# Patient Record
Sex: Male | Born: 1990 | Race: White | Hispanic: No | Marital: Single
Health system: Southern US, Community
[De-identification: ages and names within clinical notes are randomized; demographics above are authoritative.]

## PROBLEM LIST (undated history)

## (undated) HISTORY — PX: KNEE SURGERY: SHX244

---
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
## 2012-04-25 ENCOUNTER — Encounter (HOSPITAL_COMMUNITY): Payer: Self-pay | Admitting: Emergency Medicine

## 2012-04-25 ENCOUNTER — Emergency Department (INDEPENDENT_AMBULATORY_CARE_PROVIDER_SITE_OTHER)
Admission: EM | Admit: 2012-04-25 | Discharge: 2012-04-25 | Disposition: A | Discharge status: Home / Self Care | Payer: Medicaid Other | Source: Home / Self Care | Attending: Emergency Medicine | Admitting: Emergency Medicine

## 2012-04-25 DIAGNOSIS — L259 Unspecified contact dermatitis, unspecified cause: Secondary | ICD-10-CM

## 2012-04-25 MED ORDER — TRIAMCINOLONE ACETONIDE 0.1 % EX OINT: TOPICAL_OINTMENT | Freq: Two times a day (BID) | CUTANEOUS | Status: DC

## 2012-04-25 MED ORDER — PREDNISONE 20 MG PO TABS: 40.0000 mg | ORAL_TABLET | Freq: Every day | ORAL | Status: AC

## 2012-04-25 MED ORDER — CETIRIZINE HCL 10 MG PO CHEW: 10.0000 mg | CHEWABLE_TABLET | Freq: Every day | ORAL | Status: DC

## 2012-04-25 NOTE — ED Notes (Signed)
Onset 6/2.  Reports neck and face were the first areas that rash noticed.  Now has spotty areas to abdomen, arms.  Areas are red, itch, burn.

## 2012-04-25 NOTE — ED Provider Notes (Signed)
History     CSN: [id number]  Arrival date & time 04/25/12  1146   First MD Initiated Contact with Patient 04/25/12 1156      Chief Complaint  Patient presents with  . Rash    (Consider location/radiation/quality/duration/timing/severity/associated sxs/prior treatment) HPI Comments: Patient describes a for about 2 days he started with his redness on his chest and his face and now has some swelling around his eyes. It is very itchy and has some other spotty areas in his abdomen since upper arms. His red count of burns and itches he describes that he possibly was walking through a trail it could have been exposed to some type of plants.  Patient denies any other symptoms such as fevers, body aches constitutional symptoms. No respiratory symptoms  Patient is a 21 y.o. male presenting with rash. The history is provided by the patient.  Rash  This is a new problem. The current episode started 2 days ago. The problem has been gradually worsening. There has been no fever. The rash is present on the torso and face. The pain is at a severity of 3/10. The pain is mild. The pain has been constant since onset. Associated symptoms include blisters and itching. Pertinent negatives include no pain. He has tried nothing for the symptoms.    History reviewed. No pertinent past medical history.  Past Surgical History  Procedure Date  . Knee surgery     No family history on file.  History  Substance Use Topics  . Smoking status: Never Smoker   . Smokeless tobacco: Not on file  . Alcohol Use: No      Review of Systems  Constitutional: Negative for chills, activity change, fatigue and unexpected weight change.  Skin: Positive for itching and rash.    Allergies  Review of patient's allergies indicates no known allergies.  Home Medications   Current Outpatient Rx  Name Route Sig Dispense Refill  . DIPHENHYD-CALAMINE-BENZYL ALC 01-05-09.5 % EX CREA Apply externally Apply topically.    Marland Kitchen  CETIRIZINE HCL 10 MG PO CHEW Oral Chew 1 tablet (10 mg total) by mouth daily. 14 tablet 0  . PREDNISONE 20 MG PO TABS Oral Take 2 tablets (40 mg total) by mouth daily. 2 tablets daily for 5 days 10 tablet 0  . TRIAMCINOLONE ACETONIDE 0.1 % EX OINT Topical Apply topically 2 (two) times daily. X  2 weeks except face 80 g 0    BP 99/54  Pulse 52  Temp(Src) 98 F (36.7 C) (Oral)  Resp 16  SpO2 100%  Physical Exam  Constitutional: He appears well-developed and well-nourished.  HENT:  Head: Normocephalic.  Eyes: Conjunctivae are normal. Right eye exhibits no discharge. Left eye exhibits no discharge.  Neck: Neck supple.  Skin: Rash noted. There is erythema.       ED Course  Procedures (including critical care time)  Labs Reviewed - No data to display No results found.   1. Contact dermatitis   prescribed systemic prednisone along with Zyrtec course for 2 weeks and topical triamcinolone    MDM  Facial upper torso and arms with signs of contact dermatitis most likely poison ivy or poison oak. Patient has no other systemic symptoms.        Jimmie Molly, MD 04/25/12 2050

## 2012-04-25 NOTE — Discharge Instructions (Signed)
Contact Dermatitis  Contact dermatitis is a rash that happens when something touches the skin. You touched something that irritates your skin, or you have allergies to something you touched.  HOME CARE    Avoid the thing that caused your rash.   Keep your rash away from hot water, soap, sunlight, chemicals, and other things that might bother it.   Do not scratch your rash.   You can take cool baths to help stop itching.   Only take medicine as told by your doctor.   Keep all doctor visits as told.  GET HELP RIGHT AWAY IF:    Your rash is not better after 3 days.   Your rash gets worse.   Your rash is puffy (swollen), tender, red, sore, or warm.   You have problems with your medicine.  MAKE SURE YOU:    Understand these instructions.   Will watch your condition.   Will get help right away if you are not doing well or get worse.  Document Released: 09/05/2009 Document Revised: 10/28/2011 Document Reviewed: 04/13/2011  ExitCare Patient Information 2012 [employer].

## 2013-02-23 ENCOUNTER — Emergency Department (HOSPITAL_COMMUNITY)
Admission: EM | Admit: 2013-02-23 | Discharge: 2013-02-24 | Disposition: A | Discharge status: Home / Self Care | Payer: Medicaid Other | Attending: Emergency Medicine | Admitting: Emergency Medicine

## 2013-02-23 ENCOUNTER — Encounter (HOSPITAL_COMMUNITY): Payer: Self-pay | Admitting: Emergency Medicine

## 2013-02-23 DIAGNOSIS — L0291 Cutaneous abscess, unspecified: Secondary | ICD-10-CM

## 2013-02-23 DIAGNOSIS — L02419 Cutaneous abscess of limb, unspecified: Secondary | ICD-10-CM | POA: Insufficient documentation

## 2013-02-23 MED ORDER — IBUPROFEN 600 MG PO TABS: 600.0000 mg | ORAL_TABLET | Freq: Four times a day (QID) | ORAL | Status: DC | PRN

## 2013-02-23 MED ORDER — SULFAMETHOXAZOLE-TRIMETHOPRIM 800-160 MG PO TABS: 1.0000 | ORAL_TABLET | Freq: Two times a day (BID) | ORAL | Status: DC

## 2013-02-23 MED ORDER — SULFAMETHOXAZOLE-TMP DS 800-160 MG PO TABS
1.0000 | ORAL_TABLET | Freq: Once | ORAL | Status: AC
  Administered 2013-02-23: 1 via ORAL
  Filled 2013-02-23: qty 1

## 2013-02-23 NOTE — ED Provider Notes (Signed)
History    This chart was scribed for non-physician practitioner working with Sunnie Nielsen, MD by Leone Payor, ED Scribe. This patient was seen in room WTR5/WTR5 and the patient's care was started at 2205.   CSN: [id number]  Arrival date & time 02/23/13  2205   First MD Initiated Contact with Patient 02/23/13 2314      Chief Complaint  Patient presents with  . Wound Check     The history is provided by the patient. No language interpreter was used.    MONTERRIO GERST is a 22 y.o. male who presents to the Emergency Department complaining of a constant red area to R knee from an unknown bite starting a couple days ago. Pt states he tripped and fell to the ground several days ago. States he noticed a bump to his R knee after that that the pt popped. He states popping the bump drained pus. States it is no longer draining today. He denies fever, chills. Onset of symptoms gradual. Nothing makes symptoms better or worse. Patient is ambulatory  Pt denies smoking and alcohol use.  History reviewed. No pertinent past medical history.  Past Surgical History  Procedure Laterality Date  . Knee surgery      No family history on file.  History  Substance Use Topics  . Smoking status: Never Smoker   . Smokeless tobacco: Not on file  . Alcohol Use: No      Review of Systems  Constitutional: Negative for fever.  Gastrointestinal: Negative for nausea and vomiting.  Skin: Positive for color change and wound.  Hematological: Negative for adenopathy.    Allergies  Review of patient's allergies indicates no known allergies.  Home Medications   Current Outpatient Rx  Name  Route  Sig  Dispense  Refill  . ibuprofen (ADVIL,MOTRIN) 200 MG tablet   Oral   Take 800 mg by mouth every 6 (six) hours as needed for pain.           BP 114/42  Pulse 63  Temp(Src) 98.1 F (36.7 C) (Oral)  Resp 16  Ht 5\' 8"  (1.727 m)  Wt 124 lb (56.246 kg)  BMI 18.86 kg/m2  SpO2 100%  Physical Exam   Nursing note and vitals reviewed. Constitutional: He appears well-developed and well-nourished. No distress.  HENT:  Head: Normocephalic and atraumatic.  Eyes: EOM are normal.  Neck: Neck supple. No tracheal deviation present.  Cardiovascular: Normal rate.   Pulmonary/Chest: Effort normal. No respiratory distress.  Musculoskeletal: Normal range of motion.  Neurological: He is alert.  Skin: Skin is warm and dry.  3-4 cm area of erythema overlying right patella. No drainage. Full ROM in the knee.   Psychiatric: He has a normal mood and affect. His behavior is normal.    ED Course  Procedures (including critical care time)  DIAGNOSTIC STUDIES: Oxygen Saturation is 100% on room air, normal by my interpretation.    COORDINATION OF CARE: 11:30 PM Discussed treatment plan with pt at bedside and pt agreed to plan.    Labs Reviewed - No data to display No results found.   1. Abscess and cellulitis    Patient seen and examined. Work-up initiated. Medications ordered.   Vital signs reviewed and are as follows: Filed Vitals:   02/23/13 2357  BP: 100/54  Pulse: 105  Temp: 97.7 F (36.5 C)  Resp: 14   The patient was urged to return to the Emergency Department urgently with worsening pain, swelling, expanding erythema especially  if it streaks away from the affected area, fever, or if they have any other concerns. Patient verbalized understanding.      MDM  Patient with area of cellulitis overlying left patella. Area is minor without drainage. No difficulty with movement of joints to suspect septic arthritis. No fever or other systemic symptoms. I suspect the patient had a small pustule or abscess at the onset. Given this history will treat with Bactrim to cover for MRSA. Patient appears well and nontoxic.     I personally performed the services described in this documentation, which was scribed in my presence. The recorded information has been reviewed and is  accurate.   Renne Crigler, PA-C 02/24/13 [phone]

## 2013-02-23 NOTE — ED Notes (Signed)
Pt  C/o red area to R knee, unkn bite. Area red with dark red center.

## 2013-02-24 NOTE — ED Provider Notes (Signed)
Medical screening examination/treatment/procedure(s) were performed by non-physician practitioner and as supervising physician I was immediately available for consultation/collaboration.  Sunnie Nielsen, MD 02/24/13 [phone]

## 2013-07-25 ENCOUNTER — Encounter (HOSPITAL_COMMUNITY): Payer: Self-pay

## 2013-07-25 ENCOUNTER — Emergency Department (HOSPITAL_COMMUNITY): Payer: Medicaid Other

## 2013-07-25 ENCOUNTER — Emergency Department (HOSPITAL_COMMUNITY)
Admission: EM | Admit: 2013-07-25 | Discharge: 2013-07-25 | Disposition: A | Discharge status: Home / Self Care | Payer: Medicaid Other | Attending: Emergency Medicine | Admitting: Emergency Medicine

## 2013-07-25 DIAGNOSIS — Y92410 Unspecified street and highway as the place of occurrence of the external cause: Secondary | ICD-10-CM | POA: Insufficient documentation

## 2013-07-25 DIAGNOSIS — S5292XA Unspecified fracture of left forearm, initial encounter for closed fracture: Secondary | ICD-10-CM

## 2013-07-25 DIAGNOSIS — Y9389 Activity, other specified: Secondary | ICD-10-CM | POA: Insufficient documentation

## 2013-07-25 DIAGNOSIS — S52599A Other fractures of lower end of unspecified radius, initial encounter for closed fracture: Secondary | ICD-10-CM | POA: Insufficient documentation

## 2013-07-25 MED ORDER — HYDROCODONE-ACETAMINOPHEN 5-325 MG PO TABS: 1.0000 | ORAL_TABLET | Freq: Four times a day (QID) | ORAL | Status: DC | PRN
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
## 2013-07-25 NOTE — ED Notes (Signed)
Patient transported to X-ray 

## 2013-07-25 NOTE — Progress Notes (Signed)
Orthopedic Tech Progress Note Patient Details:  Joshua Austin 01-24-1991 [id number]  Ortho Devices Type of Ortho Device: Sugartong splint;Arm sling Ortho Device/Splint Interventions: Application   Cammer, Mickie Bail 07/25/2013, 10:10 AM

## 2013-07-25 NOTE — ED Notes (Signed)
Ortho notified of splint that is ordered
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
## 2013-07-29 NOTE — ED Provider Notes (Signed)
Medical screening examination/treatment/procedure(s) were performed by non-physician practitioner and as supervising physician I was immediately available for consultation/collaboration.   Jermie Hippe M Ah Bott, DO 07/29/13 1157 

## 2014-08-17 ENCOUNTER — Encounter (HOSPITAL_COMMUNITY): Payer: Self-pay | Admitting: Emergency Medicine

## 2014-08-17 ENCOUNTER — Emergency Department (HOSPITAL_COMMUNITY)
Admission: EM | Admit: 2014-08-17 | Discharge: 2014-08-18 | Disposition: A | Discharge status: Other Acute Inpatient Hospital | Payer: Medicaid Other | Attending: Emergency Medicine | Admitting: Emergency Medicine

## 2014-08-17 DIAGNOSIS — F3289 Other specified depressive episodes: Secondary | ICD-10-CM | POA: Insufficient documentation

## 2014-08-17 DIAGNOSIS — F32A Depression, unspecified: Secondary | ICD-10-CM

## 2014-08-17 DIAGNOSIS — R45851 Suicidal ideations: Secondary | ICD-10-CM | POA: Insufficient documentation

## 2014-08-17 DIAGNOSIS — X789XXA Intentional self-harm by unspecified sharp object, initial encounter: Secondary | ICD-10-CM | POA: Insufficient documentation

## 2014-08-17 DIAGNOSIS — S6990XA Unspecified injury of unspecified wrist, hand and finger(s), initial encounter: Secondary | ICD-10-CM | POA: Diagnosis not present

## 2014-08-17 DIAGNOSIS — F329 Major depressive disorder, single episode, unspecified: Secondary | ICD-10-CM

## 2014-08-17 DIAGNOSIS — F39 Unspecified mood [affective] disorder: Secondary | ICD-10-CM

## 2014-08-17 DIAGNOSIS — X838XXA Intentional self-harm by other specified means, initial encounter: Secondary | ICD-10-CM

## 2014-08-17 LAB — CBC WITH DIFFERENTIAL/PLATELET
BASOS PCT: 0 % (ref 0–1)
Basophils Absolute: 0 10*3/uL (ref 0.0–0.1)
EOS ABS: 0.1 10*3/uL (ref 0.0–0.7)
EOS PCT: 1 % (ref 0–5)
HCT: 39.9 % (ref 39.0–52.0)
HEMOGLOBIN: 14.2 g/dL (ref 13.0–17.0)
LYMPHS ABS: 2.8 10*3/uL (ref 0.7–4.0)
Lymphocytes Relative: 41 % (ref 12–46)
MCH: 29.8 pg (ref 26.0–34.0)
MCHC: 35.6 g/dL (ref 30.0–36.0)
MCV: 83.6 fL (ref 78.0–100.0)
MONO ABS: 0.5 10*3/uL (ref 0.1–1.0)
MONOS PCT: 8 % (ref 3–12)
Neutro Abs: 3.4 10*3/uL (ref 1.7–7.7)
Neutrophils Relative %: 50 % (ref 43–77)
Platelets: 228 10*3/uL (ref 150–400)
RBC: 4.77 MIL/uL (ref 4.22–5.81)
RDW: 12 % (ref 11.5–15.5)
WBC: 6.8 10*3/uL (ref 4.0–10.5)

## 2014-08-17 LAB — URINALYSIS, ROUTINE W REFLEX MICROSCOPIC
BILIRUBIN URINE: NEGATIVE
Glucose, UA: NEGATIVE mg/dL
Hgb urine dipstick: NEGATIVE
Ketones, ur: NEGATIVE mg/dL
LEUKOCYTES UA: NEGATIVE
NITRITE: NEGATIVE
Protein, ur: NEGATIVE mg/dL
SPECIFIC GRAVITY, URINE: 1.031 — AB (ref 1.005–1.030)
UROBILINOGEN UA: 0.2 mg/dL (ref 0.0–1.0)
pH: 5.5 (ref 5.0–8.0)

## 2014-08-17 LAB — COMPREHENSIVE METABOLIC PANEL
ALK PHOS: 85 U/L (ref 39–117)
ALT: 11 U/L (ref 0–53)
ANION GAP: 13 (ref 5–15)
AST: 17 U/L (ref 0–37)
Albumin: 4.3 g/dL (ref 3.5–5.2)
BUN: 12 mg/dL (ref 6–23)
CALCIUM: 9.6 mg/dL (ref 8.4–10.5)
CO2: 27 mEq/L (ref 19–32)
CREATININE: 1.13 mg/dL (ref 0.50–1.35)
Chloride: 102 mEq/L (ref 96–112)
GFR calc non Af Amer: >90 mL/min (ref >90)
GLUCOSE: 94 mg/dL (ref 70–99)
Potassium: 3.9 mEq/L (ref 3.7–5.3)
Sodium: 142 mEq/L (ref 137–147)
TOTAL PROTEIN: 7.3 g/dL (ref 6.0–8.3)
Total Bilirubin: 0.2 mg/dL — ABNORMAL LOW (ref 0.3–1.2)

## 2014-08-17 LAB — ACETAMINOPHEN LEVEL

## 2014-08-17 LAB — RAPID URINE DRUG SCREEN, HOSP PERFORMED
Amphetamines: NOT DETECTED
BARBITURATES: NOT DETECTED
Benzodiazepines: NOT DETECTED
COCAINE: NOT DETECTED
Opiates: NOT DETECTED
Tetrahydrocannabinol: NOT DETECTED

## 2014-08-17 LAB — ETHANOL

## 2014-08-17 LAB — SALICYLATE LEVEL: Salicylate Lvl: <2 mg/dL — ABNORMAL LOW (ref 2.8–20.0)

## 2014-08-17 MED ORDER — OLANZAPINE 5 MG PO TBDP
5.0000 mg | ORAL_TABLET | Freq: Every day | ORAL | Status: DC
  Administered 2014-08-17: 5 mg via ORAL
  Filled 2014-08-17: qty 1

## 2014-08-17 NOTE — ED Notes (Signed)
One belonging bag placed in locker number 27

## 2014-08-17 NOTE — ED Notes (Signed)
Pt alert x4, calm and cooperative, No thoughts of SI/HI will continue to monitor. Estill Dooms, RN 8:55 PM 08/17/2014

## 2014-08-17 NOTE — BH Assessment (Signed)
Clinician consulted with Dr. Arfeen who recommends patient for inpatient admission. Per Eric, AC no appropriate beds available at [facility]. TTS will seek placement.   Daijanae Rafalski, MSW, LCSW Triage Specialist [phone] 

## 2014-08-17 NOTE — ED Notes (Signed)
MD at bedside. 

## 2014-08-17 NOTE — ED Notes (Signed)
Pt. Refused to answer questions from this Nurse upon assessment , just mentioned of "being upset " with what his Mother told him last night. Denies pain.
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
## 2014-08-17 NOTE — ED Notes (Signed)
Patient aware we are in need of a urine sample. Patient states he is unable to void at this time.

## 2014-08-17 NOTE — Progress Notes (Signed)
The following facilities have been contacted regarding bed availability for inptx on pt's behalf:  REFERRAL FAXED: [facility]- per [facility] beds available [facility]- per Tammy can fax for review [facility]- per Clydie Braun can fax for review [facility]- per Britta Mccreedy, a few male beds available [facility]-  Per Joni Reining beds available,take [facility] up to age 21, referral faxed [facility]- per Misty Stanley can fax, referral faxed  AT CAPACITY: [facility]- per Cherene Julian- per Baird Lyons at capacity [facility]- per [place] [facility]ero bed available [facility]- per [facility]- per [facility]- male and gero beds only [facility]- per [facility]- per [facility] Disposition [facility]

## 2014-08-17 NOTE — ED Notes (Signed)
Patient ate 75% of her meal 
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
## 2014-08-17 NOTE — ED Notes (Signed)
Pt. In new scrubs. Pt. And belongings searched and wanded by security. Pt. Has 1 belongings bag. Pt. Has brown shorts,striped under pants, socks,light green tennis shoes, black and blue tank top, white and black bandana. Pt. Belongings locked up at the nurses station in the blue zone.

## 2014-08-17 NOTE — ED Notes (Signed)
Meal Tray provided to the patient.

## 2014-08-17 NOTE — ED Notes (Signed)
Patient ate 100% of his meal. 

## 2014-08-17 NOTE — ED Notes (Signed)
Pt ambulating to restroom with steady gait. He will attempt to provide [place] with a sample.
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
## 2014-08-17 NOTE — ED Notes (Signed)
Patient ate 100% of his lunch.
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
## 2014-08-17 NOTE — ED Notes (Signed)
Bed: ZO10 Expected date:  Expected time:  Means of arrival:  Comments: Bed 15, EMS, 20 M, SI

## 2014-08-18 NOTE — ED Provider Notes (Signed)
Assumed care of pt awaiting psych disposition.  Patient accepted at [facility], Dr. Jerrol Banana. Examined and evaluated patient is without complaint this time. I feel that he is stable for transfer.  Transfer documentation completed and patient transferred in stable condition.    Mirian Mo, MD 08/18/14 [phone]

## 2014-08-18 NOTE — Progress Notes (Signed)
Pt has been accepted at [facility] by Dr.Komissanoroa to room 2406-01.  RN report [id number].  Pt can arrive anytime between 6am and 11pm on 07/2714.  Blain Pais, MHT/NS

## 2014-08-18 NOTE — ED Notes (Signed)
Pt waiting for [employer]

## 2014-10-17 ENCOUNTER — Encounter (HOSPITAL_COMMUNITY): Payer: Self-pay | Admitting: Emergency Medicine

## 2014-10-17 ENCOUNTER — Emergency Department (HOSPITAL_COMMUNITY)
Admission: EM | Admit: 2014-10-17 | Discharge: 2014-10-19 | Disposition: A | Discharge status: Other Acute Inpatient Hospital | Payer: Medicaid Other | Attending: Emergency Medicine | Admitting: Emergency Medicine

## 2014-10-17 DIAGNOSIS — F322 Major depressive disorder, single episode, severe without psychotic features: Secondary | ICD-10-CM | POA: Diagnosis present

## 2014-10-17 DIAGNOSIS — R45851 Suicidal ideations: Secondary | ICD-10-CM

## 2014-10-17 DIAGNOSIS — Y998 Other external cause status: Secondary | ICD-10-CM | POA: Diagnosis not present

## 2014-10-17 DIAGNOSIS — Y9289 Other specified places as the place of occurrence of the external cause: Secondary | ICD-10-CM | POA: Insufficient documentation

## 2014-10-17 DIAGNOSIS — F333 Major depressive disorder, recurrent, severe with psychotic symptoms: Secondary | ICD-10-CM | POA: Insufficient documentation

## 2014-10-17 DIAGNOSIS — X789XXA Intentional self-harm by unspecified sharp object, initial encounter: Secondary | ICD-10-CM | POA: Diagnosis not present

## 2014-10-17 DIAGNOSIS — Y9389 Activity, other specified: Secondary | ICD-10-CM | POA: Insufficient documentation

## 2014-10-17 DIAGNOSIS — S51811A Laceration without foreign body of right forearm, initial encounter: Secondary | ICD-10-CM | POA: Diagnosis not present

## 2014-10-17 LAB — CBC
HCT: 39.8 % (ref 39.0–52.0)
HEMOGLOBIN: 13.7 g/dL (ref 13.0–17.0)
MCH: 30.5 pg (ref 26.0–34.0)
MCHC: 34.4 g/dL (ref 30.0–36.0)
MCV: 88.6 fL (ref 78.0–100.0)
Platelets: 182 10*3/uL (ref 150–400)
RBC: 4.49 MIL/uL (ref 4.22–5.81)
RDW: 12.3 % (ref 11.5–15.5)
WBC: 5.1 10*3/uL (ref 4.0–10.5)

## 2014-10-17 MED ORDER — ALUM & MAG HYDROXIDE-SIMETH 200-200-20 MG/5ML PO SUSP: 30.0000 mL | ORAL | Status: DC | PRN

## 2014-10-17 MED ORDER — ACETAMINOPHEN 325 MG PO TABS: 650.0000 mg | ORAL_TABLET | ORAL | Status: DC | PRN

## 2014-10-17 MED ORDER — ZOLPIDEM TARTRATE 5 MG PO TABS: 5.0000 mg | ORAL_TABLET | Freq: Every evening | ORAL | Status: DC | PRN

## 2014-10-17 MED ORDER — ONDANSETRON HCL 4 MG PO TABS: 4.0000 mg | ORAL_TABLET | Freq: Three times a day (TID) | ORAL | Status: DC | PRN

## 2014-10-17 MED ORDER — IBUPROFEN 200 MG PO TABS: 600.0000 mg | ORAL_TABLET | Freq: Three times a day (TID) | ORAL | Status: DC | PRN

## 2014-10-17 NOTE — BH Assessment (Signed)
Received call for assessment. Spoke with Toy CookeyMegan Docherty, MD who said Pt threatened to drink peroxide and has cuts on his forearms. Assessment will be initiated.  Harlin RainFord Ellis Ria CommentWarrick Jr, LPC, [facility] Triage Specialist [phone]

## 2014-10-17 NOTE — ED Provider Notes (Addendum)
CSN: [id number]     Arrival date & time 10/17/14  2258 History   First MD Initiated Contact with Patient 10/17/14 2307     Chief Complaint  Patient presents with  . Suicidal     (Consider location/radiation/quality/duration/timing/severity/associated sxs/prior Treatment) HPI Comments: Brought in by [employer] from home with c/o suicidal ideations. Per [employer], pt cut himself in the right forearm with intent to harm himself; pt also threatened to drink "peroxide to kill self". PTAR was called by family--- PTAR dressed pt's cuts to right forearm. [employer] was also called to the scene--- family has IVC papers on pt. .  The patient will not describe events of the evening.  States that he thinks he cut his arm coming down a set of steps.  Patient is a 23 y.o. male presenting with mental health disorder. The history is provided by the patient and the police. The history is limited by the absence of a caregiver. No language interpreter was used.  Mental Health Problem Presenting symptoms: suicidal threats   Presenting symptoms: no agitation   Patient accompanied by:  Law enforcement Degree of incapacity (severity):  Unable to specify Onset quality:  Unable to specify Timing:  Unable to specify Progression:  Unable to specify Chronicity:  Chronic Treatment compliance:  Untreated Relieved by:  Nothing Worsened by:  Nothing tried Ineffective treatments:  None tried Associated symptoms: no abdominal pain, no appetite change, no chest pain, no fatigue and no headaches   Risk factors: hx of mental illness, hx of suicide attempts and recent psychiatric admission     History reviewed. No pertinent past medical history. Past Surgical History  Procedure Laterality Date  . Knee surgery     History reviewed. No pertinent family history. History  Substance Use Topics  . Smoking status: Never Smoker   . Smokeless tobacco: Not on file  . Alcohol Use: No    Review of Systems  Constitutional: Negative for  fever, activity change, appetite change and fatigue.  HENT: Negative for congestion, facial swelling, rhinorrhea and trouble swallowing.   Eyes: Negative for photophobia and pain.  Respiratory: Negative for cough, chest tightness and shortness of breath.   Cardiovascular: Negative for chest pain and leg swelling.  Gastrointestinal: Negative for nausea, vomiting, abdominal pain, diarrhea and constipation.  Endocrine: Negative for polydipsia and polyuria.  Genitourinary: Negative for dysuria, urgency, decreased urine volume and difficulty urinating.  Musculoskeletal: Negative for back pain and gait problem.  Skin: Positive for wound. Negative for color change and rash.  Allergic/Immunologic: Negative for immunocompromised state.  Neurological: Negative for dizziness, facial asymmetry, speech difficulty, weakness, numbness and headaches.  Psychiatric/Behavioral: Negative for confusion, decreased concentration and agitation.      Allergies  Review of patient's allergies indicates no known allergies.  Home Medications   Prior to Admission medications   Not on File   BP 95/51 mmHg  Pulse 59  Temp(Src) 98.1 F (36.7 C) (Oral)  Resp 18  SpO2 100% Physical Exam  Constitutional: He is oriented to person, place, and time. He appears well-developed and well-nourished. No distress.  HENT:  Head: Normocephalic and atraumatic.  Mouth/Throat: No oropharyngeal exudate.  Eyes: Pupils are equal, round, and reactive to light.  Neck: Normal range of motion. Neck supple.  Cardiovascular: Normal rate, regular rhythm and normal heart sounds.  Exam reveals no gallop and no friction rub.   No murmur heard. Pulmonary/Chest: Effort normal and breath sounds normal. No respiratory distress. He has no wheezes. He has no rales.  Abdominal: Soft. Bowel sounds are normal. He exhibits no distension and no mass. There is no tenderness. There is no rebound and no guarding.  Musculoskeletal: Normal range of  motion. He exhibits no edema or tenderness.  Neurological: He is alert and oriented to person, place, and time.  Skin: Skin is warm and dry.     Psychiatric: He has a normal mood and affect.    ED Course  Procedures (including critical care time) Labs Review Labs Reviewed  SALICYLATE LEVEL - Abnormal; Notable for the following:    Salicylate Lvl <2.0 (*)    All other components within normal limits  CBC  COMPREHENSIVE METABOLIC PANEL  ETHANOL  ACETAMINOPHEN LEVEL  URINE RAPID DRUG SCREEN (HOSP PERFORMED)    Imaging Review No results found.   EKG Interpretation None      MDM   Final diagnoses:  Depression  Suicidal ideations    Pt is a 23 y.o. male with Pmhx as above who presents with IVC paperwork filed by his mother for concern for suicidal ideations.  [place] [facility]  Patient reportedly threatened to drink peroxide to kill himself and had also superficially cut his right forearm.  Patient will not describe events of the evening.  To me, but does admit to saying that he was going to drink peroxide.  He will not say that this was to kill himself also not say that the cuts on his arm were made by himself or that they were to kill himself.  He denies current SI or HI.  Denies any recent illnesses or injuries.  Tetanus is up-to-date.  First opinion file by myself.  Psych will be asked to evaluate.  Patient has been seen by TTS, meets inpatient admission requirements, however, there are no nodes available at behavioral health.  Patient will be reevaluated by psychiatry in the morning     Toy CookeyMegan Maxi Carreras, MD 10/18/14 [id number]  Toy CookeyMegan Jaquaya Coyle, MD 10/18/14 [phone]

## 2014-10-17 NOTE — ED Notes (Signed)
Pt belongings: tan pants, shirt, sneakers, grey hat, socks, and underwear. Pt has been wanded

## 2014-10-17 NOTE — ED Notes (Signed)
Brought in by [employer] from home with c/o suicidal ideations.  Per [employer], pt cut himself in the right forearm with intent to harm himself; pt also threatened to drink "peroxide to kill self".  PTAR was called by family--- PTAR dressed pt's cuts to right forearm.  [employer] was also called to the scene--- family has IVC papers on pt.

## 2014-10-18 ENCOUNTER — Encounter (HOSPITAL_COMMUNITY): Payer: Self-pay | Admitting: Psychiatry

## 2014-10-18 DIAGNOSIS — F333 Major depressive disorder, recurrent, severe with psychotic symptoms: Secondary | ICD-10-CM

## 2014-10-18 DIAGNOSIS — F322 Major depressive disorder, single episode, severe without psychotic features: Secondary | ICD-10-CM | POA: Diagnosis present

## 2014-10-18 DIAGNOSIS — R45851 Suicidal ideations: Secondary | ICD-10-CM

## 2014-10-18 LAB — COMPREHENSIVE METABOLIC PANEL
ALK PHOS: 78 U/L (ref 39–117)
ALT: 13 U/L (ref 0–53)
ANION GAP: 11 (ref 5–15)
AST: 18 U/L (ref 0–37)
Albumin: 4.2 g/dL (ref 3.5–5.2)
BILIRUBIN TOTAL: 0.3 mg/dL (ref 0.3–1.2)
BUN: 13 mg/dL (ref 6–23)
CO2: 27 meq/L (ref 19–32)
Calcium: 9.6 mg/dL (ref 8.4–10.5)
Chloride: 103 mEq/L (ref 96–112)
Creatinine, Ser: 1.03 mg/dL (ref 0.50–1.35)
GLUCOSE: 99 mg/dL (ref 70–99)
POTASSIUM: 4 meq/L (ref 3.7–5.3)
SODIUM: 141 meq/L (ref 137–147)
Total Protein: 7.1 g/dL (ref 6.0–8.3)

## 2014-10-18 LAB — SALICYLATE LEVEL: Salicylate Lvl: <2 mg/dL — ABNORMAL LOW (ref 2.8–20.0)

## 2014-10-18 LAB — ACETAMINOPHEN LEVEL: Acetaminophen (Tylenol), Serum: <15 ug/mL (ref 10–30)

## 2014-10-18 LAB — ETHANOL: Alcohol, Ethyl (B): <11 mg/dL (ref 0–11)

## 2014-10-18 MED ORDER — FLUOXETINE HCL 20 MG PO CAPS
20.0000 mg | ORAL_CAPSULE | Freq: Every day | ORAL | Status: DC
  Administered 2014-10-18 – 2014-10-19 (×2): 20 mg via ORAL
  Filled 2014-10-18 (×2): qty 1

## 2014-10-18 NOTE — ED Notes (Addendum)
Patient arrived to unit, pt withdrawn and with minimal interaction. Pt with dull, flat affect endorsing depression; pt not making any eye contact with RN. Pt minimizing suicide attempt, and as RN inquired about scratches to pt's arm he stated "It's nothing, you don't need to look at them". RN did assess arms and noted superficial scratches to right forearm. Pt denies SI at this time and verbally contracts for safety. No s/s of distress noted. Pt given a sandwich and a soda.
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
## 2014-10-18 NOTE — ED Notes (Signed)
Report received from Waynetta SandyJan Wright RN. Pt. Alert and oriented in no distress denies SI, HI, AVH and pain. Will continue to monitor for safety. Pt. Instructed to come to me with problems or concerns. Q 15 minute checks continue.
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
## 2014-10-18 NOTE — BH Assessment (Signed)
Berneice Heinrichina Tate, [facility], confirms adult unit is at capacity. Nanine MeansJamison Lord, NP recommends inpatient psychiatric treatment. Contacted the following facilities for placement:  BED AVAILABLE, FAXED CLINICAL INFORMATION: [facility]  AT CAPACITY: [facility]  NO RESPONSE: [facility]   [address]Ford Ellis Patsy BaltimoreWarrick Jr, [place]LPC, [place] Triage Specialist [phone]

## 2014-10-18 NOTE — BH Assessment (Signed)
[facility].  Harlin RainFord Ellis Ria CommentWarrick Jr, LPC, [facility] Triage Specialist [phone]

## 2014-10-18 NOTE — BH Assessment (Signed)
Assessment complete. Binnie RailJoann Glover, [facility], confirms adult unit is currently at capacity. Gave clinical report to Maryjean Mornharles Kober, PA-C who recommends Pt be observed in the ED and evaluated by psychiatry in the morning. Notified Toy CookeyMegan Docherty, MD and Rosemarie BeathMichelle Sadler, RN of recommendation.  Harlin RainFord Ellis Ria CommentWarrick Jr, LPC, [facility] Triage Specialist [phone]

## 2014-10-18 NOTE — ED Notes (Signed)
Pt is calm and cooperative on the unit. Pt started Prozac this afternoon. Pt reports that he is stressed. He currently denies si and hi thoughts. Safety maintained in the SAPPU.

## 2014-10-19 ENCOUNTER — Encounter (HOSPITAL_COMMUNITY): Payer: Self-pay

## 2014-10-19 ENCOUNTER — Inpatient Hospital Stay (HOSPITAL_COMMUNITY)
Admission: AD | Admit: 2014-10-19 | Discharge: 2014-10-24 | DRG: 885 | Disposition: A | Discharge status: Home / Self Care | Payer: Medicaid Other | Attending: Psychiatry | Admitting: Psychiatry

## 2014-10-19 ENCOUNTER — Encounter (HOSPITAL_COMMUNITY): Payer: Self-pay | Admitting: Psychiatry

## 2014-10-19 DIAGNOSIS — F332 Major depressive disorder, recurrent severe without psychotic features: Secondary | ICD-10-CM | POA: Diagnosis present

## 2014-10-19 DIAGNOSIS — R45851 Suicidal ideations: Secondary | ICD-10-CM

## 2014-10-19 DIAGNOSIS — F4321 Adjustment disorder with depressed mood: Secondary | ICD-10-CM | POA: Diagnosis present

## 2014-10-19 DIAGNOSIS — F322 Major depressive disorder, single episode, severe without psychotic features: Secondary | ICD-10-CM | POA: Diagnosis present

## 2014-10-19 DIAGNOSIS — F329 Major depressive disorder, single episode, unspecified: Secondary | ICD-10-CM | POA: Diagnosis present

## 2014-10-19 DIAGNOSIS — F333 Major depressive disorder, recurrent, severe with psychotic symptoms: Secondary | ICD-10-CM | POA: Insufficient documentation

## 2014-10-19 MED ORDER — MAGNESIUM HYDROXIDE 400 MG/5ML PO SUSP: 30.0000 mL | Freq: Every day | ORAL | Status: DC | PRN

## 2014-10-19 MED ORDER — ONDANSETRON HCL 4 MG PO TABS
4.0000 mg | ORAL_TABLET | Freq: Three times a day (TID) | ORAL | Status: DC | PRN
Start: 2014-10-19 — End: 2014-10-24

## 2014-10-19 MED ORDER — ZOLPIDEM TARTRATE 5 MG PO TABS: 5.0000 mg | ORAL_TABLET | Freq: Every evening | ORAL | Status: DC | PRN

## 2014-10-19 MED ORDER — ALUM & MAG HYDROXIDE-SIMETH 200-200-20 MG/5ML PO SUSP: 30.0000 mL | ORAL | Status: DC | PRN

## 2014-10-19 MED ORDER — ACETAMINOPHEN 325 MG PO TABS: 650.0000 mg | ORAL_TABLET | Freq: Four times a day (QID) | ORAL | Status: DC | PRN

## 2014-10-19 MED ORDER — FLUOXETINE HCL 20 MG PO CAPS
20.0000 mg | ORAL_CAPSULE | Freq: Every day | ORAL | Status: DC
  Administered 2014-10-20 – 2014-10-24 (×5): 20 mg via ORAL
  Filled 2014-10-19: qty 1
  Filled 2014-10-19: qty 14
  Filled 2014-10-19 (×6): qty 1

## 2014-10-19 MED ORDER — IBUPROFEN 600 MG PO TABS
600.0000 mg | ORAL_TABLET | Freq: Three times a day (TID) | ORAL | Status: DC | PRN
Start: 2014-10-19 — End: 2014-10-24

## 2014-10-19 MED ORDER — INFLUENZA VAC SPLIT QUAD 0.5 ML IM SUSY
0.5000 mL | PREFILLED_SYRINGE | INTRAMUSCULAR | Status: AC
  Administered 2014-10-21: 0.5 mL via INTRAMUSCULAR
  Filled 2014-10-19: qty 0.5

## 2014-10-19 NOTE — ED Notes (Signed)
[facility] contacted for transport to [facility] 

## 2014-10-19 NOTE — ED Notes (Signed)
Calmer, still will not talk

## 2014-10-19 NOTE — ED Notes (Signed)
Sitting quielty,declined PO fluids.

## 2014-10-19 NOTE — ED Notes (Signed)
Up to the bathroom 
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
## 2014-10-19 NOTE — BH Assessment (Signed)
Received a call from [facility] stating that patient is declined by Rhae Lernerina Iheanacho,NP. Per NP patient is not appropriate for admission to there facility due to his anger acuity.   Glorious PeachNajah Tiegan Jambor, MS, LCASA Assessment Counselor

## 2014-10-19 NOTE — ED Notes (Signed)
[facility]/ [facility], belongings give to [employer] on transport

## 2014-10-19 NOTE — ED Notes (Signed)
Up the bathroom  

## 2014-10-19 NOTE — ED Notes (Signed)
[employer] here to transport 

## 2014-10-19 NOTE — ED Notes (Signed)
Watching tv., pt continues to decline prozac

## 2014-10-19 NOTE — Progress Notes (Signed)
D: Pt's mood is depressed. Flat affect. He denies SI/HI. Responds minimally when talking with this Clinical research associatewriter. Attended group tonight and seems to interact well within the milieu. A: Support given. Verbalization encouraged. Pt encouraged to come to staff with any concerns. Medications given as prescribed.  R: Pt is receptive. No complaints of pain or discomfort at this time. Q15 min safety checks maintained. Will continue to monitor pt.

## 2014-10-19 NOTE — ED Notes (Signed)
Pt sitting quielty in room, crying.  Pt declined to talk or discuss what is going on, answers questions only w/ nodding his head. pt declined to take prozac. PO fluids offered, blanket give. Pt encouraged to discuss what up set him.

## 2014-10-19 NOTE — Progress Notes (Signed)
The patient attended the A. A. Meeting this evening and was appropriate.  
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
## 2014-10-20 DIAGNOSIS — F322 Major depressive disorder, single episode, severe without psychotic features: Secondary | ICD-10-CM

## 2014-10-20 NOTE — [facility] Group Notes (Signed)
The focus of this group is to educate the patient on the purpose and policies of crisis stabilization and provide a format to answer questions about their admission.  The group details unit policies and expectations of patients while admitted.  Patient attended 0900 nurse education orientation group this morning.  Patient actively participated, appropriate affect, alert, appropriate insight and engagement.  Today patient will work on 3 goals for discharge.  

## 2014-10-20 NOTE — [facility] Group Notes (Signed)
[facility] LCSW Group Therapy  10/20/2014   10:00 AM   Type of Therapy:  Group Therapy  Participation Level:  Active  Participation Quality:  Appropriate and Attentive  Affect:  Appropriate, Depressed  Cognitive:  Alert and Appropriate  Insight:  Developing/Improving and Engaged  Engagement in Therapy:  Developing/Improving and Engaged  Modes of Intervention:  Clarification, Confrontation, Discussion, Education, Exploration, Limit-setting, Orientation, Problem-solving, Rapport Building, Dance movement psychotherapisteality Testing, Socialization and Support  Summary of Progress/Problems: The main focus of today's process group was to identify the patient's current support system and decide on other supports that can be put in place.  An emphasis was placed on using counselor, doctor, therapy groups, 12-step groups, and problem-specific support groups to expand supports, as well as doing something different than has been done before.  Pt discussed his family helping him get out of jail and allowing him to live in their home, but feels they do not show love or positive support, as they are always fighting.  Pt states that his best friend and girlfriend are his only support.  Discussed pt having to make living with his family work for the time being, as this is his only place to live until he gets a job and off of probation.  Discussed pt creating positive support outside of his home.     Joshua IvanChelsea Horton, LCSW 10/20/2014 10:34 AM

## 2014-10-20 NOTE — Progress Notes (Signed)
Joshua Austin is seen out in the milieu. HE is quiet but pleasant and cooperative. He completes his morning assessment and on it he writes he denies SI within the past 24 hrs, he rates his depression, hopelessness and anxiety "0/0/0" respectively and he says " i want to go home" first thing this morning.   A he demonstrates minimal engagement as evidenced by his constant statements about leaving.   R Safety  In place.
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
## 2014-10-20 NOTE — BHH Suicide Risk Assessment (Signed)
   Nursing information obtained from:  Patient Demographic factors:  Adolescent or young adult, Caucasian Current Mental Status:  NA Loss Factors:  Loss of significant relationship, Legal issues, Financial problems / change in socioeconomic status Historical Factors:  Anniversary of important loss Risk Reduction Factors:    Total Time spent with patient: 1 hour  CLINICAL FACTORS:   Depression:   Anhedonia Hopelessness Impulsivity Severe More than one psychiatric diagnosis Unstable or Poor Therapeutic Relationship Previous Psychiatric Diagnoses and Treatments  Psychiatric Specialty Exam: Physical Exam  ROS  Blood pressure 90/56, pulse 53, temperature 98.5 F (36.9 C), temperature source Oral, resp. rate 16, height 5\' 8"  (1.727 m), weight 61.236 kg (135 lb), SpO2 100 %.Body mass index is 20.53 kg/(m^2).  General Appearance: Casual  Eye Contact::  Fair  Speech:  Slow  Volume:  Decreased  Mood:  Anxious, Depressed, Dysphoric, Hopeless and Irritable  Affect:  Constricted and Depressed  Thought Process:  Intact  Orientation:  Full (Time, Place, and Person)  Thought Content:  Rumination  Suicidal Thoughts:  Yes.  with intent/plan  Homicidal Thoughts:  No  Memory:  Immediate;   Fair Recent;   Fair Remote;   Fair  Judgement:  Impaired  Insight:  Lacking  Psychomotor Activity:  Increased  Concentration:  Fair  Recall:  [employer]Fair  Fund of Knowledge:Fair  Language: Fair  Akathisia:  No  Handed:  Right  AIMS (if indicated):     Assets:  Communication Skills Housing  Sleep:  Number of Hours: 6.25   Musculoskeletal: Strength & Muscle Tone: within normal limits Gait & Station: normal Patient leans: N/A  COGNITIVE FEATURES THAT CONTRIBUTE TO RISK:  Closed-mindedness Loss of executive function Polarized thinking Thought constriction (tunnel vision)    SUICIDE RISK:   Severe:  Frequent, intense, and enduring suicidal ideation, specific plan, no subjective intent, but some  objective markers of intent (i.e., choice of lethal method), the method is accessible, some limited preparatory behavior, evidence of impaired self-control, severe dysphoria/symptomatology, multiple risk factors present, and few if any protective factors, particularly a lack of social support.  PLAN OF CARE:  I certify that inpatient services furnished can reasonably be expected to improve the patient's condition.  Shevon Sian T. 10/20/2014, 11:56 AM

## 2014-10-21 DIAGNOSIS — F329 Major depressive disorder, single episode, unspecified: Secondary | ICD-10-CM

## 2014-10-21 NOTE — [facility] Group Notes (Signed)
   [facility]  10/21/2014  8:45 AM   Participation Quality: Alert, Appropriate and Oriented  Mood/Affect: Appropriate  Depression Rating: 0  Anxiety Rating: 0  Thoughts of Suicide: Pt denies SI/HI  Will you contract for safety? Yes  Current AVH: Pt denies  Plan for Discharge/Comments: Pt attended discharge planning group and actively participated in group. CSW provided pt with today's workbook. Patient reports that he feels "great" today and was admitted due to SI threats and cutting himself after an argument with his family. Patient is unsure at this time if he is unable to return home with his parents. Continuing to assess for appropriate follow up services.  Transportation Means: Pt reports access to transportation  Supports: No supports mentioned at this time  Samuella BruinKristin Erasmo Vertz, MSW, [employer]LCSWA Clinical Social Worker [facility] [phone]

## 2014-10-21 NOTE — Progress Notes (Signed)
Psychoeducational Group Note  Date:  10/21/2014 Time:  0108  Group Topic/Focus:  Wrap-Up Group:   The focus of this group is to help patients review their daily goal of treatment and discuss progress on daily workbooks.  Participation Level: Did Not Attend  Participation Quality:  Not Applicable  Affect:  Not Applicable  Cognitive:  Not Applicable  Insight:  Not Applicable  Engagement in Group: Not Applicable  Additional Comments:  The patient did attend the A.A. Meeting last evening and was appropriate.   Marton Malizia S 10/21/2014, 1:08 AM

## 2014-10-21 NOTE — Progress Notes (Signed)
Patient ID: Joshua Austin, male   DOB: 1991/05/21, 23 y.o.   MRN: [id number] He has been up and about and to groups. Has been interacting with peers and staff. He denies SI thoughts and pain. Goal : need to talk to parents today.

## 2014-10-21 NOTE — Progress Notes (Signed)
Adult Psychoeducational Group Note  Date:  10/21/2014 Time:  9:39 PM  Group Topic/Focus:  Wrap-Up Group:   The focus of this group is to help patients review their daily goal of treatment and discuss progress on daily workbooks.  Participation Level:  Active  Participation Quality:  Appropriate  Affect:  Appropriate  Cognitive:  Appropriate  Insight: Good  Engagement in Group:  Engaged  Modes of Intervention:  Discussion  Additional Comments:  Pt goal was to speak with mom today and he spoke with her for a couple of minutes.  Casilda CarlsKELLY, Derward Marple H 10/21/2014, 9:39 PM

## 2014-10-21 NOTE — Progress Notes (Signed)
[facility]  10/21/2014 2:02 PM Christell FaithRyan W Marohl  MRN:  [id number] Subjective: Patient states he feels better today. He states he felt hurt by his father's comments that his sister's boyfriend " has better morals than I do".  He states this led him to feel acutely upset . He engaged in self cutting and threatened to drink peroxide, leading to admission. He describes a stormy relationship with father. He also describes other stressors, such as being on probation , which has made it difficult for him to get a job. Objective: Patient states he is "OK" He does appear somewhat depressed and ruminates about his father's criticisms and perceived lack of support/love from father. He lives with his father and mother and states he is not sure if they would be willing to take him back at this time. He denies any medication side effects. Prozac is a new medication for him. He states he had been prescribed an antidepressant in the past but had not been taking it. Denies drug abuse or alcohol abuse. On unit he has been calm and has been going to groups/is visible in milieu. 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 rate 18, height 5\' 8"  (1.727 m), weight 61.236 kg (135 lb), SpO2 100 %.Body mass index is 20.53 kg/(m^2).  General Appearance: Fairly Groomed  Patent attorneyye Contact::  Good  Speech:  Normal Rate  Volume:  Normal  Mood:  Depressed, but states he is feeling better today  Affect:  Constricted and and anxious, but reactive, and does smile at times appropriately  Thought Process:  Goal Directed and Linear   Orientation:  Full (Time, Place, and Person)  Thought Content:  denies hallucinations, no delusions  Suicidal Thoughts:  No at this time denies any thoughts of hurting self and  contracts for safety on unit   Homicidal Thoughts:  No  Memory:  recent and remote grossly intact   Judgement:  Fair  Insight:  Fair  Psychomotor Activity:  Normal  Concentration:  Good  Recall:  Good  Fund of Knowledge:Good  Language: Good  Akathisia:  Negative  Handed:  Right  AIMS (if indicated):     Assets:  Physical Health Resilience  Sleep:  Number of Hours: 6   Musculoskeletal: Strength & Muscle Tone: within normal limits Gait & Station: normal Patient leans: N/A  Current Medications: Current Facility-Administered Medications  Medication Dose Route Frequency Provider Last Rate Last Dose  . acetaminophen (TYLENOL) tablet 650 mg  650 mg Oral Q6H PRN Nanine MeansJamison Lord, NP      . alum & mag hydroxide-simeth (MAALOX/MYLANTA) 200-200-20 MG/5ML suspension 30 mL  30 mL Oral Q4H PRN Nanine MeansJamison Lord, NP      . FLUoxetine (PROZAC) capsule 20 mg  20 mg Oral Daily Nanine MeansJamison Lord, NP   20 mg at 10/21/14 0850  . ibuprofen (ADVIL,MOTRIN) tablet 600 mg  600 mg Oral Q8H PRN Nanine MeansJamison Lord, NP      . magnesium hydroxide (MILK OF MAGNESIA) suspension 30 mL  30 mL Oral Daily PRN Nanine MeansJamison Lord, NP      . ondansetron [facility](ZOFRAN) tablet 4 mg  4 mg Oral Q8H PRN Nanine MeansJamison Lord, NP      . zolpidem (AMBIEN) tablet 5 mg  5 mg Oral QHS PRN Nanine MeansJamison Lord, NP        Lab Results: No results found for this or any previous visit (from the past 48 hour(s)).  Physical Findings: AIMS: Facial and Oral Movements Muscles of Facial Expression: None, normal Lips and Perioral Area: None, normal Jaw: None, normal Tongue: None, normal,Extremity Movements Upper (arms, wrists, hands, fingers): None, normal Lower (legs, knees, ankles, toes): None, normal, Trunk Movements Neck, shoulders, hips: None, normal, Overall Severity Severity of abnormal  movements (highest score from questions above): None, normal Incapacitation due to abnormal movements: None, normal Patient's awareness of abnormal movements (rate only patient's report): No Awareness, Dental Status Current problems with teeth and/or dentures?: No Does patient usually wear dentures?: No  CIWA:    COWS:      Assessment: At this time patient is partially improved - he denies any ongoing suicidal ideations, behavior is in good control. He does continue to present with some depression, but states he is feeling better than upon admission. He reports a history of stormy relationship with father, and his self cutting and threats to ingest peroxide came after feeling rejected by father. He is on Prozac and is tolerating this medication well thus far.  Treatment Plan Summary: Daily contact with patient to assess and evaluate symptoms and progress in treatment Medication management See below  Plan: Continue inpatient treatment , milieu, support Continue Prozac 20 mgrs QDAY  On Ambien PRN for insomnia SW /team to attempt to contact father/mother ( with patient's consent) in order to determine if patient can/is returning there after discharge. I have encouraged patient to consider a family meeting to discuss the above issues/stressors. He is ambivalent at this time.   Medical Decision Making Problem Points:  Established problem, stable/improving (1), Review of last therapy session (1) and Review of psycho-social stressors (1) Data Points:  Review of medication regiment & side effects (2)  I certify that inpatient services furnished can reasonably be expected to improve the patient's condition.   Hashim Eichhorst 10/21/2014, 2:02 PM

## 2014-10-21 NOTE — [facility] Group Notes (Signed)
[facility] LCSW Group Therapy 10/21/2014  1:15 pm  Type of Therapy: Group Therapy Participation Level: Active  Participation Quality: Attentive, Sharing and Supportive  Affect: Depressed and Flat  Cognitive: Alert and Oriented  Insight: Developing/Improving and Engaged  Engagement in Therapy: Developing/Improving and Engaged  Modes of Intervention: Clarification, Confrontation, Discussion, Education, Exploration,  Limit-setting, Orientation, Problem-solving, Rapport Building, Dance movement psychotherapisteality Testing, Socialization and Support  Summary of Progress/Problems: Pt identified obstacles faced currently and processed barriers involved in overcoming these obstacles. Pt identified steps necessary for overcoming these obstacles and explored motivation (internal and external) for facing these difficulties head on. Pt further identified one area of concern in their lives and chose a goal to focus on for today. Patient identified family relationships as an obstacle. He discussed feeling misunderstood by his family. CSW and other group members provided emotional support and encouragement.  Samuella BruinKristin Kelbi Renstrom, MSW, [employer]CSWA Clinical Social Worker [facility] [phone]

## 2014-10-21 NOTE — Progress Notes (Signed)
Pt was sarcastic in interaction as he denied having any SI/HI/AVH. Pt is up and active within the milieu. Pt denied any concerns he wished for this writer to address at this time.  A: Continued support and availability as needed was extended to this pt. Staff continue to monitor pt with q9115min checks.  R: Pt receptive to treatment. Pt remains safe at this time.

## 2014-10-22 NOTE — Progress Notes (Signed)
Recreation Therapy Notes  Animal-Assisted Activity/Therapy (AAA/T) Program Checklist/Progress Notes Patient Eligibility Criteria Checklist & Daily Group note for Rec Tx Intervention  Date: 12.01.2015 Time: 2:45pm Location: [address] Dayroom   AAA/T Program Assumption of Risk Form signed by Patient/ or Parent Legal Guardian yes  Patient is free of allergies or sever asthma yes  Patient reports no fear of animals yes  Patient reports no history of cruelty to animals yes  Patient understands his/her participation is voluntary yes  Patient washes hands before animal contact yes  Patient washes hands after animal contact yes  Behavioral Response: Appropriate   Education: Hand Washing, Appropriate Animal Interaction   Education Outcome: Acknowledges education.   Clinical Observations/Feedback: Patient interacted with therapy dog, petting him appropriately from floor level. Patient socialized with peers in session, conversation remained appropriate.   Marykay Lexenise L Aleena Kirkeby, LRT/CTRS  Darvin Dials L 10/22/2014 5:07 PM

## 2014-10-22 NOTE — Clinical Social Work Note (Signed)
Patient provided CSW with permission to contact his parents. CSW spoke with patient's father Dewain Penningric Dorce who reports that he and wife do not want patient to return home at this time. They report that he creates conflict and is verbally and physically aggressive with family members in the home. Father reports that patient has threatened to kill family members before when angry and is disrespectful to parents. CSW validated father's concerns and inquired about parents interest in a family meeting tomorrow 12/2. Father is considering at this time.  Samuella BruinKristin Jennipher Weatherholtz, MSW, [employer]CSWA Clinical Social Worker [facility] [phone]

## 2014-10-22 NOTE — Progress Notes (Signed)
Patient ID: Joshua Austin, male   DOB: May 08, 1991, 23 y.o.   MRN: [id number] D: Client visible on the unit, in dayroom watching TV and interacting appropriately with peers. Client denies SHI. A: Writer introduced self to client, encouraged him to verbalize any concerns. Writer noted medication was available if he was unable to sleep. Staff will monitor q115min for safety. R: Client is safe on the unit, attended group, reports to problem with sleeping.

## 2014-10-22 NOTE — Progress Notes (Signed)
  Adult Psychoeducational Group Note  Date:  10/22/2014 Time:  9:09 PM  Group Topic/Focus:  Wrap-Up Group:   The focus of this group is to help patients review their daily goal of treatment and discuss progress on daily workbooks.  Participation Level:  Active  Participation Quality:  Sharing  Affect:  Appropriate  Cognitive:  Appropriate  Insight: Good  Engagement in Group:  Engaged  Modes of Intervention:  Discussion  Additional Comments:  Pt stated he guess his goal was accomplished with the doctor requesting to have a family session before d/c. Pt stated he guess it will work but once d/c things will go right back to the same situation with his family. Pt also said he will try his best to work on himself.    Bernadene PersonKELLY, Tenya Araque H 10/22/2014, 9:09 PM

## 2014-10-22 NOTE — Progress Notes (Signed)
D: Pt up and active within the milieu. Pt presents appropriate in affect and mood. Pt present for group. Pt accomplished his goal of speaking to his parents today. Parents are wishing for this pt receive continued tx upon discharge. Pt is voicing interest in [facility].  A:Continued support and availability as needed was extended to this pt. Staff continue to monitor pt with q4415min checks.  R: Pt receptive to treatment. Pt remains safe at this time.

## 2014-10-22 NOTE — Tx Team (Signed)
Interdisciplinary Treatment Plan Update (Adult) Date: 10/22/2014   Time Reviewed: 9:30 AM  Progress in Treatment: Attending groups: Yes Participating in groups: Yes Taking medication as prescribed: Yes Tolerating medication: Yes Family/Significant other contact made: No, CSW continuing to assess for appropriate contact Patient understands diagnosis: Yes Discussing patient identified problems/goals with staff: Yes Medical problems stabilized or resolved: Yes Denies suicidal/homicidal ideation: Yes Issues/concerns per patient self-inventory: Yes Other:  New problem(s) identified: N/A  Discharge Plan or Barriers: CSW continuing to assess. Patient hopes to discharge back home with his parents and is agreeable to outpatient services yet to be determined.  Reason for Continuation of Hospitalization:  Depression Anxiety Medication Stabilization   Comments: N/A  Estimated length of stay: 1-2 days  For review of initial/current patient goals, please see plan of care. Patient is a 23 year old Caucasian Male with a diagnosis of Major Depressive Disorder. [place]. Pt explained that he often gets into arguments with his parents and this last one with his father triggers him to overdose and cut his arms for attention. Pt states that he was kicked out of the home but should be able to return there after talking to them. Patient will benefit from crisis stabilization, medication evaluation, group therapy and psycho education in addition to case management for discharge planning. Discharge Process and Patient Expectations information sheet signed by patient, witnessed by writer and inserted in patient's shadow chart.    Attendees: Patient:    Family:    Physician: Dr. Jama Flavorsobos; Dr. Dub MikesLugo 10/22/2014 9:30 AM  Nursing: Roswell Minersonna Shimp, Kathi SimpersSarah Twyman, Lendell CapriceBrittany Guthrie, RN 10/22/2014 9:30 AM  Clinical Social Worker: Samuella BruinKristin Zarya Lasseigne,  LCSWA 10/22/2014 9:30 AM  Other:  Juline PatchQuylle Hodnett, LCSW 10/22/2014 9:30 AM  Other: Leisa LenzValerie Enoch, [facility] Liaison 10/22/2014 9:30 AM  Other: Onnie BoerJennifer Clark, Case Manager 12/1//2015 9:30 AM  Other:  Mosetta AnisAggie Nwoko, Laura Davis, NP 12/1//2015 9:30 AM  Other:          Scribe for Treatment Team:  Samuella BruinKristin Meloni Hinz, MSW, LCSWA [phone]

## 2014-10-22 NOTE — [facility] Group Notes (Signed)
[facility] LCSW Group Therapy  10/22/2014   1:15 PM   Type of Therapy:  Group Therapy  Participation Level:  Active  Participation Quality:  Attentive, Sharing and Supportive  Affect:  Appropriate  Cognitive:  Alert and Oriented  Insight:  Developing/Improving and Engaged  Engagement in Therapy:  Developing/Improving and Engaged  Modes of Intervention:  Clarification, Confrontation, Discussion, Education, Exploration, Limit-setting, Orientation, Problem-solving, Rapport Building, Dance movement psychotherapisteality Testing, Socialization and Support  Summary of Progress/Problems: The topic for group therapy was feelings about diagnosis.  Pt actively participated in group discussion on their past and current diagnosis and how they feel towards this.  Pt also identified how society and family members judge them, based on their diagnosis as well as stereotypes and stigmas.  Patient reports that he does not feel ashamed of his diagnosis and acknowledges that he needs help managing his symptoms sometimes. CSW's and other group members provided emotional support and encouragement.  Samuella BruinKristin Jaleya Pebley, MSW, [employer]CSWA Clinical Social Worker [facility] [phone]

## 2014-10-22 NOTE — Progress Notes (Signed)
Patient ID: Joshua Austin, male   DOB: 1991/02/05, 23 y.o.   MRN: [id number] He has been up and to groups interacting with peers and staff. Has not c/o any discomfort nor asked for any prn medications. Self inventory were marked with  All 0's. Denies SI thoughts. Goal: groups and to go home.

## 2014-10-22 NOTE — [facility] Group Notes (Signed)
[facility] Group Notes:  (Nursing/MHT/Case Management/Adjunct)  Date:  10/22/2014  Time:  0845am  Type of Therapy:  Psychoeducational Skills  Participation Level:  Active  Participation Quality:  Appropriate and Attentive  Affect:  Blunted  Cognitive:  Appropriate  Insight:  Lacking  Engagement in Group:  Engaged  Modes of Intervention:  Education and Support  Summary of Progress/Problems: Pt reports his recovery goal is "getting better, facing recovery, and learning from it."  [place]uthrie, [place]ittany A 10/22/2014, 2:20 PM

## 2014-10-22 NOTE — Progress Notes (Signed)
Patient ID: Joshua Austin, male   DOB: December 26, 1990, 23 y.o.   MRN: [id number] [facility] MD Progress Note  10/22/2014 12:25 PM Joshua Austin  MRN:  [id number] Subjective: Patient reports feeling "OK" today. States his Mood is " all right" and today is not endorsing any significant depression. He is focused on being discharged soon, and states he is needing to complete some community service hours prior to the end of this week in order to be in compliance with his probation officer.  Objective: I have discussed case with treatment team and have met with patient. Patient states he is "OK" today. He denies any severe depression today, and describes mood as "OK". He seems less ruminative about his poor relationship with his father. He states he had a good conversation with his mother last night, and it helped him feel better. As discussed with staff, patient has been visible in day room, going to groups and interacting in milieu. At present patient is focused on being discharged soon , and is presenting with future orientation, stating he needs to complete some community service hours within the next several days.  He denies any medication side effects- on Prozac. He denies any self injurious ideations or activation/agitation on this medication thus far.  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 rate 20, height _0  (1.727 m), weight 61.236 kg (135 lb), SpO2 100 %.Body mass index is 20.53 kg/(m^2).  General Appearance: improved grooming   Eye Contact::  Good  Speech:  Normal Rate  Volume:  Normal  Mood:  denies  feeling depressed today, describes mood as a"OK" today,   Affect:  appropriate  Thought Process:  Goal Directed and Linear  Orientation:  Full (Time, Place, and Person)  Thought Content:  denies hallucinations, no delusions  Suicidal Thoughts:  No at this time denies any thoughts of hurting self and  contracts for safety on unit   Homicidal Thoughts:  No  Memory:  recent and remote grossly intact   Judgement:  Fair  Insight:  Fair  Psychomotor Activity:  Normal  Concentration:  Good  Recall:  Good  Fund of Knowledge:Good  Language: Good  Akathisia:  Negative  Handed:  Right  AIMS (if indicated):     Assets:  Physical Health Resilience  Sleep:  Number of Hours: 5.75   Musculoskeletal: Strength & Muscle Tone: within normal limits Gait & Station: normal Patient leans: N/A  Current Medications: Current Facility-Administered Medications  Medication Dose Route Frequency Provider Last Rate Last Dose  . acetaminophen (TYLENOL) tablet 650 mg  650 mg Oral Q6H PRN Waylan Boga, NP      . alum & mag hydroxide-simeth (MAALOX/MYLANTA) 200-200-20 MG/5ML suspension 30 mL  30 mL Oral Q4H PRN Waylan Boga, NP      . FLUoxetine (PROZAC) capsule 20 mg  20 mg Oral Daily Waylan Boga, NP   20 mg at 10/22/14 0825  . ibuprofen (ADVIL,MOTRIN) tablet 600 mg  600 mg Oral Q8H PRN Waylan Boga, NP      . magnesium hydroxide (MILK OF MAGNESIA) suspension 30 mL  30 mL Oral Daily PRN  Waylan Boga, NP      . ondansetron [facility]) tablet 4 mg  4 mg Oral Q8H PRN Waylan Boga, NP      . zolpidem (AMBIEN) tablet 5 mg  5 mg Oral QHS PRN Waylan Boga, NP        Lab Results: No results found for this or any previous visit (from the past 48 hour(s)).  Physical Findings: AIMS: Facial and Oral Movements Muscles of Facial Expression: None, normal Lips and Perioral Area: None, normal Jaw: None, normal Tongue: None, normal,Extremity Movements Upper (arms, wrists, hands, fingers): None, normal Lower (legs, knees,  ankles, toes): None, normal, Trunk Movements Neck, shoulders, hips: None, normal, Overall Severity Severity of abnormal movements (highest score from questions above): None, normal Incapacitation due to abnormal movements: None, normal Patient's awareness of abnormal movements (rate only patient's report): No Awareness, Dental Status Current problems with teeth and/or dentures?: No Does patient usually wear dentures?: No  CIWA:    COWS:      Assessment: Currently patient presents with improvement compared to admission. He presents with improved mood, improved range of affect, and today denies depression. Also denies any SI. Behavior on unit in good control, and participative in milieu. Hoping for discharge soon.  Treatment Plan Summary: Daily contact with patient to assess and evaluate symptoms and progress in treatment Medication management See below  Plan: Continue inpatient treatment , milieu, support Continue Prozac 20 mgrs QDAY  On Ambien PRN for insomnia    Medical Decision Making Problem Points:  Established problem, stable/improving (1), Review of last therapy session (1) and Review of psycho-social stressors (1) Data Points:  Review of medication regiment & side effects (2)  I certify that inpatient services furnished can reasonably be expected to improve the patient's condition.   Parissa Chiao 10/22/2014, 12:25 PM

## 2014-10-22 NOTE — Progress Notes (Signed)
Pt attended spiritual care group on grief and loss facilitated by counseling intern [place] Austin and chaplain Burnis KingfisherMatthew Uzma Hellmer. Group opened with brief discussion and psycho-social ed around grief and loss in relationships and in relation to self - identifying life patterns, circumstances, changes that cause losses. Established group norm of speaking from own life experience. Group goal of establishing open and affirming space for members to share loss and experience with grief, normalize grief experience and provide psycho social education and grief support.  Alycia RossettiRyan was present throughout the group and behaved appropriately. Franklyn did not share with the group, but remained in the room for the duration of session.   [place]Jordan Austin  Counseling Intern

## 2014-10-23 DIAGNOSIS — F332 Major depressive disorder, recurrent severe without psychotic features: Principal | ICD-10-CM

## 2014-10-23 NOTE — [facility] Group Notes (Signed)
   [facility] Dis[facility] Planning Group Note  10/23/2014  8:45 AM   Participation Quality: Alert, Appropriate and Oriented  Mood/Affect: Appropriate  Depression Rating: 0  Anxiety Rating: 0  Thoughts of Suicide: Pt denies SI/HI  Will you contract for safety? Yes  Current AVH: Pt denies  Plan for Discharge/Comments: Pt attended discharge planning group and actively participated in group. CSW provided pt with today's workbook. Patient reports feeling "good and ready for discharge" and denies depression or anxiety. Patient not able to return home with his parents at this time, CSW to discuss alternative housing/shelter options with patient. [facility].  Transportation Means: Pt reports access to transportation  Supports: No supports mentioned at this time  Samuella BruinKristin Reynard Christoffersen, MSW, [employer]CS[employer] Clinical Social Worker [facility] [phone]

## 2014-10-23 NOTE — [facility] Suicide Risk Assessment (Signed)
[facility] INPATIENT:  Family/Significant Other Suicide Prevention Education  Suicide Prevention Education:  Education Completed; Dewain Penningric Knabe [phone],  (name of family member/significant other) has been identified by the patient as the family member/significant other with whom the patient will be residing, and identified as the person(s) who will aid the patient in the event of a mental health crisis (suicidal ideations/suicide attempt).  With written consent from the patient, the family member/significant other has been provided the following suicide prevention education, prior to the and/or following the discharge of the patient.  The suicide prevention education provided includes the following:  Suicide risk factors  Suicide prevention and interventions  National Suicide Hotline telephone number  [facility] assessment telephone number  [facility] Emergency Assistance 911  [facility] and/or Residential Mobile Crisis Unit telephone number  Request made of family/significant other to:  Remove weapons (e.g., guns, rifles, knives), all items previously/currently identified as safety concern.    Remove drugs/medications (over-the-counter, prescriptions, illicit drugs), all items previously/currently identified as a safety concern.  The family member/significant other verbalizes understanding of the suicide prevention education information provided.  The family member/significant other agrees to remove the items of safety concern listed above.  Giovannie Scerbo, West CarboKristin L 10/23/2014, 11:54 AM

## 2014-10-23 NOTE — [facility] Group Notes (Addendum)
[facility] LCSW Group Therapy 10/23/2014  1:15 PM Type of Therapy: Group Therapy Participation Level: Active  Participation Quality: Attentive, Sharing and Supportive  Affect: Appropriate  Cognitive: Alert and Oriented  Insight: Developing/Improving and Engaged  Engagement in Therapy: Developing/Improving and Engaged  Modes of Intervention: Clarification, Confrontation, Discussion, Education, Exploration, Limit-setting, Orientation, Problem-solving, Rapport Building, Dance movement psychotherapisteality Testing, Socialization and Support  Summary of Progress/Problems: The topic for group today was emotional regulation. This group focused on both positive and negative emotion identification and allowed group members to process ways to identify feelings, regulate negative emotions, and find healthy ways to manage internal/external emotions. Group members were asked to reflect on a time when their reaction to an emotion led to a negative outcome and explored how alternative responses using emotion regulation would have benefited them. Group members were also asked to discuss a time when emotion regulation was utilized when a negative emotion was experienced. Patient reports experiencing sadness, anger, and feeling unloved related to stressors. Patient discussed ways that he has handled anger in the past. CSW's emphasized the negative effects of substances on mood and discussed alternative ways to cope with anxiety and anger. CSW's and other group members provided emotional support and encouragement.   Samuella BruinKristin Jenell Dobransky, MSW, [employer] Clinical Social Worker [facility] [phone]

## 2014-10-23 NOTE — Progress Notes (Signed)
D: Patient's affect appropriate to circumstance and mood is anxious, but pleasant. He reported on the self inventory sheet that his sleep, appetite and ability to concentrate are all good and energy level is normal. Patient rated depression, feelings of hopelessness and anxiety "0". He's actively participating in groups, visible in the milieu and interactive with peers in the dayroom. Writer observed patient laughing and joking around with others on the hall. Patient is compliant with medication.  A: Support and encouragement provided to patient. Scheduled medication administered per MD order. Maintain Q15 minute checks for safety.  R: Patient receptive. Denies SI/HI and AVH. Patient remains safe.

## 2014-10-23 NOTE — Progress Notes (Signed)
Patient ID: Joshua Austin, male   DOB: [birth year], 23 y.o.   MRN: [id number] [facility] MD Progress Note  10/23/2014 1:46 PM Joshua Austin  MRN:  [id number] Subjective:  Patient states he is "OK" but acknowledges ongoing depression.   Objective: I have discussed case with treatment team and have met with patient. Patient has minimized depression/sadness , but at this time continues to present with sadness/tearfulness. He is visible on unit, interactive in groups, and interactive with select peers. Patient continues to present with depression and remained tearful during some of our session today. Although he tends to minimize impact of relationship difficulties with father, it is clear that this is a major issue for him and he becomes tearful when touching upon this issue. With his express consent and in his presence, I spoke with his mother and father on the phone. Father reported patient has been " disrespectful", " bullying his sister and mother", not following household rules. He is reluctant for patient to return home at this time. Both mother and father did state they loved patient and wanted to improve relationship with him, but that " he has to change first ". I offered a family meeting , but father was non commital. Mother did state she was going to try to visit patient today. We spent time discussing strategies to better manage conflict with his father, and ways to improve on his family relationships.  He denies any suicidal ideations and contracts for safety on unit. He denies medication side effects.  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  Respiratory: Negative for cough and shortness of breath.   Cardiovascular: Negative for chest pain.  Gastrointestinal: Negative for nausea, vomiting and diarrhea.  Skin: Negative for rash.  Neurological: Negative for seizures, loss of consciousness and headaches.  Psychiatric/Behavioral: Positive for depression. The patient is nervous/anxious.     Blood pressure 101/48, pulse 57, temperature 98 F (36.7 C), temperature source Oral, resp. rate 20, height 5' 8" (1.727 m), weight 61.236 kg (135 lb), SpO2 100 %.Body mass index is 20.53 kg/(m^2).  General Appearance: improved grooming   Eye Contact::  Good  Speech:  Normal Rate  Volume:  Normal  Mood:  minimizes depression, but still appears sad and intermittently tearful,   Affect:  Depressed  Thought Process:  Goal Directed and Linear  Orientation:  Full (Time, Place, and Person)  Thought Content:  denies hallucinations, no delusions  Suicidal Thoughts:  No at this time denies any thoughts of hurting self and  contracts for safety on unit   Homicidal Thoughts:  No  Memory:  recent and remote grossly intact   Judgement:  Fair  Insight:  seems to be improving  Psychomotor Activity:  Normal  Concentration:  Good  Recall:  Good  Fund of Knowledge:Good  Language: Good  Akathisia:  Negative  Handed:  Right  AIMS (if indicated):     Assets:  Physical Health Resilience  Sleep:  Number of Hours: 6.75   Musculoskeletal: Strength & Muscle Tone: within normal limits Gait & Station: normal Patient leans: N/A  Current Medications: Current Facility-Administered Medications  Medication Dose Route Frequency Provider  Last Rate Last Dose  . acetaminophen (TYLENOL) tablet 650 mg  650 mg Oral Q6H PRN Waylan Boga, NP      . alum & mag hydroxide-simeth (MAALOX/MYLANTA) 200-200-20 MG/5ML suspension 30 mL  30 mL Oral Q4H PRN Waylan Boga, NP      . FLUoxetine (PROZAC) capsule 20 mg  20 mg Oral Daily Waylan Boga, NP   20 mg at  10/23/14 0824  . ibuprofen (ADVIL,MOTRIN) tablet 600 mg  600 mg Oral Q8H PRN Waylan Boga, NP      . magnesium hydroxide (MILK OF MAGNESIA) suspension 30 mL  30 mL Oral Daily PRN Waylan Boga, NP      . ondansetron (ZOFRAN) tablet 4 mg  4 mg Oral Q8H PRN Waylan Boga, NP      . zolpidem (AMBIEN) tablet 5 mg  5 mg Oral QHS PRN Waylan Boga, NP        Lab Results: No results found for this or any previous visit (from the past 48 hour(s)).  Physical Findings: AIMS: Facial and Oral Movements Muscles of Facial Expression: None, normal Lips and Perioral Area: None, normal Jaw: None, normal Tongue: None, normal,Extremity Movements Upper (arms, wrists, hands, fingers): None, normal Lower (legs, knees, ankles, toes): None, normal, Trunk Movements Neck, shoulders, hips: None, normal, Overall Severity Severity of abnormal movements (highest score from questions above): None, normal Incapacitation due to abnormal movements: None, normal Patient's awareness of abnormal movements (rate only patient's report): No Awareness, Dental Status Current problems with teeth and/or dentures?: No Does patient usually wear dentures?: No  CIWA:    COWS:      Assessment:  Although minimizes depression, remains sad and intermittently tearful, particularly regarding family stressors and strained Relationship with father.   He is more amenable to discuss this stressor, and to work on strategies to improve relationships. A family meeting  With parents /patient would be helpful, and has been offered to parents. He is Tolerating Prozac well.  Treatment Plan Summary: Daily contact with patient to assess and evaluate symptoms and progress in treatment Medication management See below  Plan: Continue inpatient treatment , milieu, support Family meeting if patient and parents agree.  Continue Prozac 20 mgrs QDAY  On Ambien PRN for insomnia    Medical Decision Making Problem Points:  Established problem,  stable/improving (1), Review of last therapy session (1) and Review of psycho-social stressors (1) Data Points:  Review of medication regiment & side effects (2)  I certify that inpatient services furnished can reasonably be expected to improve the patient's condition.   Meia Emley 10/23/2014, 1:46 PM

## 2014-10-24 DIAGNOSIS — F332 Major depressive disorder, recurrent severe without psychotic features: Secondary | ICD-10-CM | POA: Insufficient documentation

## 2014-10-24 MED ORDER — FLUOXETINE HCL 20 MG PO CAPS: 20.0000 mg | ORAL_CAPSULE | Freq: Every day | ORAL | Status: AC

## 2014-10-24 MED ORDER — ZOLPIDEM TARTRATE 5 MG PO TABS: 5.0000 mg | ORAL_TABLET | Freq: Every evening | ORAL | Status: AC | PRN

## 2014-10-24 MED ORDER — FLUOXETINE HCL 20 MG PO CAPS: 20.0000 mg | ORAL_CAPSULE | Freq: Every day | ORAL | Status: DC

## 2014-10-24 NOTE — Progress Notes (Signed)
Discharge Note: Discharge instructions/prescriptions/medication samples and letter given to patient. Patient verbalized understanding of discharge instructions and prescriptions. Returned belongings to patient. Denies SI/HI/AVH. Patient d/c without incident to the lobby and transported home by mother.

## 2014-10-24 NOTE — [facility] Group Notes (Signed)
[facility] LCSW Group Therapy 10/24/2014 1:15 PM Type of Therapy: Group Therapy Participation Level: Active  Participation Quality: Attentive, Sharing and Supportive  Affect: Depressed and Flat  Cognitive: Alert and Oriented  Insight: Developing/Improving and Engaged  Engagement in Therapy: Developing/Improving and Engaged  Modes of Intervention: Activity, Clarification, Confrontation, Discussion, Education, Exploration, Limit-setting, Orientation, Problem-solving, Rapport Building, Dance movement psychotherapisteality Testing, Socialization and Support  Summary of Progress/Problems: Patient was attentive and engaged with speaker from [employer]. Patient was attentive to speaker while they shared their story of dealing with mental health and overcoming it. Patient expressed interest in their programs and services and received information on their agency. Patient processed ways they can relate to the speaker.   Joshua Austin, MSW, [employer] Clinical Social Worker [facility] [phone]

## 2014-10-24 NOTE — Progress Notes (Signed)
[facility] :  Will you be returning to the same living situation after discharge: Yes,  patient will return home with his parents At discharge, do you have transportation home?:Yes,  patient reports that his parents will provide transportation Do you have the ability to pay for your medications:Yes,  patient will be provided with medication samples and prescriptions at discharge  Release of information consent forms completed and in the chart;  Patient's signature needed at discharge.  Patient to Follow up at: Follow-up Information    [facility]   Specialty:  Behavioral Health   Why:  [facility] this date or any day Monday-Friday between 8 am to 3 pm for assessment for therapy and medication management services.   Contact information:   [address] [place] [address] [phone]       Patient denies SI/HI:   Yes,  denies    Safety Planning and Suicide Prevention discussed:  Yes,  with patient and father  Johny ChessDrinkard, [place] Mittman L 10/24/2014, 10:09 AM

## 2014-10-24 NOTE — Clinical Social Work Note (Signed)
CSW and MD met with patient to discuss discharge plans. Patient reports feeling ready to discharge back home with his parents today and denies SI/HI. Patient states that his parents and siblings visited him last night and that he is able to return home at discharge. Patient agreeable to follow up with [facility] for outpatient services.  Tilden Fossa, MSW, [place] Worker [facility] [phone]

## 2014-10-24 NOTE — Progress Notes (Signed)
Recreation Therapy Notes  Animal-Assisted Activity/Therapy (AAA/T) Program Checklist/Progress Notes Patient Eligibility Criteria Checklist & Daily Group note for Rec Tx Intervention  Date: 12.03.2015 Time: 2:45pm Location: [address] Dayroom    AAA/T Program Assumption of Risk Form signed by Patient/ or Parent Legal Guardian yes  Patient is free of allergies or sever asthma yes  Patient reports no fear of animals yes  Patient reports no history of cruelty to animals yes  Patient understands his/her participation is voluntary yes  Patient washes hands before animal contact yes  Patient washes hands after animal contact yes  Behavioral Response: Appropriate   Education: Hand Washing, Appropriate Animal Interaction   Education Outcome: Acknowledges education.   Clinical Observations/Feedback: Patient engaged with therapy dog petting him appropriately from floor level and fed therapy dog treats in exchange for performing basic obedience skills.   Marykay Lexenise L Dhruti Ghuman, LRT/CTRS  Jearl KlinefelterBlanchfield, Nachum Derossett L 10/24/2014 4:34 PM

## 2014-10-24 NOTE — [facility] Suicide Risk Assessment (Signed)
Demographic Factors:  23 year old single male, lives with parents  Total Time spent with patient: 30 minutes  Psychiatric Specialty Exam: Physical Exam  ROS  Blood pressure 101/48, pulse 57, temperature 98 F (36.7 C), temperature source Oral, resp. rate 20, height 5\' 8"  (1.727 m), weight 61.236 kg (135 lb), SpO2 100 %.Body mass index is 20.53 kg/(m^2).  General Appearance: improved grooming  Eye Contact::  Good  Speech:  Normal Rate  Volume:  Normal  Mood:  improved mood, today seems euthymic. Denies depression at this time. Affect full in range at present.  Affect:  Full Range  Thought Process:  Goal Directed and Linear  Orientation:  Full (Time, Place, and Person)  Thought Content:  no hallucinations, no delusions  Suicidal Thoughts:  No -denies any suicidal plan or intent and contracts for safety on the unit  Homicidal Thoughts:  No  Memory:  Recent and Remote grossly intact  Judgement:  Fair  Insight:  improving   Psychomotor Activity:  Normal  Concentration:  Good  Recall:  Good  Fund of Knowledge:Good  Language: Good  Akathisia:  Negative  Handed:  Right  AIMS (if indicated):     Assets:  Desire for Improvement Physical Health Resilience  Sleep:  Number of Hours: 6.5    Musculoskeletal: Strength & Muscle Tone: within normal limits Gait & Station: normal Patient leans: N/A   Mental Status Per Nursing Assessment::   On Admission:  NA  Current Mental Status by Physician: At this time patient is much improved- today he presents euthymic and with a full range, bright affect. No thought disorder, denies any suicidal or homicidal ideations. No psychotic symptoms.  Loss Factors: Strained relationship with father, legal issues  Historical Factors: Depression, history of self cutting, one prior psychiatric admission  Risk Reduction Factors:   Sense of responsibility to family, Living with another person, especially a relative and Positive coping skills or  problem solving skills  Continued Clinical Symptoms:  At this time much improved and presents euthymic, and with a full range of affect. He states he had a family visit by his father and mother yesterday which went well- states that him and his father had a " good talk" and that they were able to come to an understanding and so will be allowed back home at this time. In this context his mood is now much improved- euthymic at present. Denies medication side effects.  Cognitive Features That Contribute To Risk:  No gross cognitive deficits noted upon discharge. Is alert , attentive, and oriented x 3   Suicide Risk:  Mild:  Suicidal ideation of limited frequency, intensity, duration, and specificity.  There are no identifiable plans, no associated intent, mild dysphoria and related symptoms, good self-control (both objective and subjective assessment), few other risk factors, and identifiable protective factors, including available and accessible social support.  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 No pertinent past medical history. AXIS IV:  problems related to legal system/crime and problems related to social environment AXIS V:  65 upon discharge    Plan Of Care/Follow-up recommendations:  Activity:  As tolerated Diet:  Regular Tests:  NA Other:  See below  Is patient on multiple antipsychotic therapies at discharge:  No   Has Patient had three or more failed trials of antipsychotic monotherapy by history:  No  Recommended Plan for Multiple Antipsychotic  Therapies: NA  Patient is leaving unit in good spirits. He plans to go back home-  Reports  his parents have decided to let him back home.  We reviewed medication side effects prior to discharge, to include potential increased risk for suicidal ideations  Early in treatment with antidepressants in young adults.   Follow up as below.  [facility].    Specialty: Behavioral Health   Why: [facility] date or any day Monday-Friday between 8 am to 3 pm for assessment for therapy and medication management services.   Contact information:   [address] [place] [address] [phone]    Nehemiah MassedCOBOS, FERNANDO 10/24/2014, 12:10 PM

## 2014-10-24 NOTE — Progress Notes (Signed)
Pt did attend NA group this evening.  

## 2014-10-24 NOTE — [facility] Group Notes (Signed)
0900 nursing orientation group   The focus of this group is to educate the patient on the purpose and policies of crisis stabilization and provide a format to answer questions about their admission.  The group details unit policies and expectations of patients while admitted.  Pt was an active participant in group and appropriate in sharing. 

## 2014-10-25 NOTE — Discharge Summary (Signed)
Physician Discharge Summary Note  Patient:  Joshua Austin is an 23 y.o., male MRN:  [id number] DOB:  [birth year] Patient phone:  [phone] (home)  Patient address:   [address]  [place] [address],  Total Time spent with patient: 30 minutes  Date of Admission:  10/19/2014 Date of Discharge: 10/24/2014  Reason for Admission:   Depression, SI  Discharge Diagnoses:  MDD without psychotic features, versus Adjustment Disorder with Depression  Active Problems:   Severe major depression without psychotic features   Major depressive disorder, recurrent, severe without psychotic features   Psychiatric Specialty Exam: Physical Exam  Vitals reviewed. Psychiatric: He has a normal mood and affect. His behavior is normal. Judgment and thought content normal. Cognition and memory are normal.    Review of Systems  Constitutional: Negative.   HENT: Negative.   Eyes: Negative.   Respiratory: Negative.   Cardiovascular: Negative.   Gastrointestinal: Negative.   Genitourinary: Negative.   Musculoskeletal: Negative.   Skin: Negative.   Neurological: Negative.   Endo/Heme/Allergies: Negative.   Psychiatric/Behavioral: Positive for depression (hx of, stabilized). Negative for suicidal ideas, hallucinations, memory loss and substance abuse. The patient is nervous/anxious (hx of, stabilized). The patient does not have insomnia.     Blood pressure 101/48, pulse 57, temperature 98 F (36.7 C), temperature source Oral, resp. rate 20, height 5\' 8"  (1.727 m), weight 61.236 kg (135 lb), SpO2 100 %.Body mass index is 20.53 kg/(m^2).   Musculoskeletal: Strength & Muscle Tone: within normal limits Gait & Station: normal Patient leans: N/A  Past Psychiatric History: Diagnosis: Social anxiety disorder,   [facility]ospital[facility] [place], [place]  Outpatient Care: None reported  Substance Abuse Care: Denies  Self-Mutilation: "yes, I cut"  Suicidal Attempts: Denies   Violent Behaviors:    DSM5: Schizophrenia Disorders:  NA Obsessive-Compulsive Disorders:  NA Trauma-Stressor Disorders:  NA Substance/Addictive Disorders:  NA Depressive Disorders:  Major Depressive Disorder - Severe (296.23)  Discharge Diagnoses:  AXIS I: MDD without psychotic features, versus Adjustment Disorder with Depression AXIS II: Deferred AXIS III: History reviewed. No pertinent past medical history. AXIS IV: problems related to legal system/crime and problems related to social environment AXIS V: 65 upon discharge   Level of Care:  OP  Hospital Course:   Joshua Austin is a 23 yo male patient who came in reporting, "me & my dad got into argument or something. I threatened to drink peroxide, I did some wrist cutting as well."  He stated he had cut self in the past, been depressed, and been in trouble with the law.  He attributes to having a lot of stressors in his life.  Patient was admitted for further crisis management.  Patient needed medication management and group therapy offered on the unit as part of his care plan.  For depression, he was given  Prozac 20 mg and Ambien PRN for insomnia.  Patient did not report any side effects and was complaint with regimen.  He minimizes depression/sadness however, remained tearful and sad whenever the discussion of his relationship with father is broached.  He interacted well with other patients on the unit.  No disruptive behaviors noted.  He is visible in group therapy.    Family expressed that they are reluctant in accepting their son back but willing to try.   He plans to go back home and continue follow up treatment with [facility].  Medications were reviewed for side effects.  On day of discharge, patient is leaving unit  in good spirits.  He is going home with his parents.  He denies any suicidal ideations.  Consults:  psychiatry  Significant Diagnostic Studies:  labs: per ED  Discharge Vitals:   Blood pressure 101/48,  pulse 57, temperature 98 F (36.7 C), temperature source Oral, resp. rate 20, height 5\' 8"  (1.727 m), weight 61.236 kg (135 lb), SpO2 100 %. Body mass index is 20.53 kg/(m^2). Lab Results:   No results found for this or any previous visit (from the past 72 hour(s)).  Physical Findings: AIMS: Facial and Oral Movements Muscles of Facial Expression: None, normal Lips and Perioral Area: None, normal Jaw: None, normal Tongue: None, normal,Extremity Movements Upper (arms, wrists, hands, fingers): None, normal Lower (legs, knees, ankles, toes): None, normal, Trunk Movements Neck, shoulders, hips: None, normal, Overall Severity Severity of abnormal movements (highest score from questions above): None, normal Incapacitation due to abnormal movements: None, normal Patient's awareness of abnormal movements (rate only patient's report): No Awareness, Dental Status Current problems with teeth and/or dentures?: No Does patient usually wear dentures?: No  CIWA:    COWS:     Psychiatric Specialty Exam: See Psychiatric Specialty Exam and Suicide Risk Assessment completed by Attending Physician prior to discharge.  Discharge destination:  Home  Is patient on multiple antipsychotic therapies at discharge:  No   Has Patient had three or more failed trials of antipsychotic monotherapy by history:  No  Recommended Plan for Multiple Antipsychotic Therapies: NA     Medication List    TAKE these medications      Indication   FLUoxetine 20 MG capsule  Commonly known as:  PROZAC  Take 1 capsule (20 mg total) by mouth daily.   Indication:  Major Depressive Disorder     zolpidem 5 MG tablet  Commonly known as:  AMBIEN  Take 1 tablet (5 mg total) by mouth at bedtime as needed for sleep.   Indication:  Trouble Sleeping           Follow-up Information    [facility] On 10/24/2014.   Specialty:  Behavioral Health   Why:  Please present to [facility] on this date or any day  Monday-Friday between 8 am to 3 pm for assessment for therapy and medication management services.   Contact informationElpidio Eric:   [address] [place] [address] [phone]       Follow-up recommendations:  Activity:  as tol, diet as tol  Comments:  1.  Take all your medications as prescribed.              2.  Report any adverse side effects to outpatient provider.                       3.  Patient instructed to not use alcohol or illegal drugs while on prescription medicines.            4.  In the event of worsening symptoms, instructed patient to call 911, the crisis hotline or go to nearest emergency room for evaluation of symptoms.  Total Discharge Time:  Greater than 30 minutes.  SignedAdonis Brook: AGUSTIN, SHEILA MAY, AGNP-BC 10/25/2014, 9:22 AM   Patient seen, Suicide Assessment Completed.  Disposition Plan Reviewed

## 2014-10-28 NOTE — Progress Notes (Signed)
Patient Discharge Instructions:  After Visit Summary (AVS):   Faxed to:  10/28/14 Discharge Summary Note:   Faxed to:  10/28/14 Psychiatric Admission Assessment Note:   Faxed to:  10/28/14 Suicide Risk Assessment - Discharge Assessment:   Faxed to:  10/28/14 Faxed/Sent to the Next Level Care provider:  10/28/14 Faxed to [facility] @ [phone]  Jerelene ReddenSheena E Alpine, 10/28/2014, 2:25 PM

## 2019-03-23 ENCOUNTER — Other Ambulatory Visit: Payer: Self-pay

## 2019-03-23 ENCOUNTER — Emergency Department (HOSPITAL_COMMUNITY)
Admission: EM | Admit: 2019-03-23 | Discharge: 2019-03-24 | Disposition: A | Discharge status: Home / Self Care | Payer: Self-pay | Attending: Emergency Medicine | Admitting: Emergency Medicine

## 2019-03-23 ENCOUNTER — Encounter (HOSPITAL_COMMUNITY): Payer: Self-pay | Admitting: Emergency Medicine

## 2019-03-23 DIAGNOSIS — R197 Diarrhea, unspecified: Secondary | ICD-10-CM | POA: Insufficient documentation

## 2019-03-23 DIAGNOSIS — K921 Melena: Secondary | ICD-10-CM | POA: Insufficient documentation

## 2019-03-23 DIAGNOSIS — Z79899 Other long term (current) drug therapy: Secondary | ICD-10-CM | POA: Insufficient documentation

## 2019-03-23 LAB — CBC
HCT: 40.9 % (ref 39.0–52.0)
Hemoglobin: 13.9 g/dL (ref 13.0–17.0)
MCH: 29.9 pg (ref 26.0–34.0)
MCHC: 34 g/dL (ref 30.0–36.0)
MCV: 88 fL (ref 80.0–100.0)
Platelets: 204 10*3/uL (ref 150–400)
RBC: 4.65 MIL/uL (ref 4.22–5.81)
RDW: 12.1 % (ref 11.5–15.5)
WBC: 10.2 10*3/uL (ref 4.0–10.5)
nRBC: 0 % (ref 0.0–0.2)

## 2019-03-23 LAB — URINALYSIS, ROUTINE W REFLEX MICROSCOPIC
Bilirubin Urine: NEGATIVE
Glucose, UA: NEGATIVE mg/dL
Hgb urine dipstick: NEGATIVE
Ketones, ur: NEGATIVE mg/dL
Leukocytes,Ua: NEGATIVE
Nitrite: NEGATIVE
Protein, ur: NEGATIVE mg/dL
Specific Gravity, Urine: 1.015 (ref 1.005–1.030)
pH: 7 (ref 5.0–8.0)

## 2019-03-23 LAB — COMPREHENSIVE METABOLIC PANEL WITH GFR
ALT: 43 U/L (ref 0–44)
AST: 33 U/L (ref 15–41)
Albumin: 4.3 g/dL (ref 3.5–5.0)
Alkaline Phosphatase: 77 U/L (ref 38–126)
Anion gap: 9 (ref 5–15)
BUN: 11 mg/dL (ref 6–20)
CO2: 29 mmol/L (ref 22–32)
Calcium: 9.6 mg/dL (ref 8.9–10.3)
Chloride: 102 mmol/L (ref 98–111)
Creatinine, Ser: 1.05 mg/dL (ref 0.61–1.24)
GFR calc Af Amer: >60 mL/min
GFR calc non Af Amer: >60 mL/min
Glucose, Bld: 99 mg/dL (ref 70–99)
Potassium: 3.6 mmol/L (ref 3.5–5.1)
Sodium: 140 mmol/L (ref 135–145)
Total Bilirubin: 0.7 mg/dL (ref 0.3–1.2)
Total Protein: 7 g/dL (ref 6.5–8.1)

## 2019-03-23 LAB — LIPASE, BLOOD: Lipase: 26 U/L (ref 11–51)

## 2019-03-23 MED ORDER — SODIUM CHLORIDE 0.9 % IV BOLUS
1000.0000 mL | Freq: Once | INTRAVENOUS | Status: AC
  Administered 2019-03-24: 1000 mL via INTRAVENOUS

## 2019-03-23 MED ORDER — DICYCLOMINE HCL 10 MG PO CAPS
10.0000 mg | ORAL_CAPSULE | Freq: Once | ORAL | Status: AC
  Administered 2019-03-24: 10 mg via ORAL
  Filled 2019-03-23: qty 1

## 2019-03-23 MED ORDER — SODIUM CHLORIDE 0.9% FLUSH: 3.0000 mL | Freq: Once | INTRAVENOUS | Status: DC

## 2019-03-23 NOTE — ED Triage Notes (Addendum)
Patient here with abdominal pain and diarrhea today.  He states that has been having some cold chills and just not feeling well.  He states he had some blood and mucous from his rectum earlier after the diarrhea.

## 2019-03-23 NOTE — ED Notes (Signed)
Updated on wait for treatment room. 

## 2019-03-24 LAB — POC OCCULT BLOOD, ED: Fecal Occult Bld: POSITIVE — AB

## 2019-03-24 LAB — LACTIC ACID, PLASMA: Lactic Acid, Venous: 1.6 mmol/L (ref 0.5–1.9)

## 2019-03-24 MED ORDER — DICYCLOMINE HCL 10 MG PO CAPS: 10.0000 mg | ORAL_CAPSULE | Freq: Three times a day (TID) | ORAL | 0 refills | Status: AC

## 2019-03-24 MED ORDER — CIPROFLOXACIN HCL 500 MG PO TABS: 500.0000 mg | ORAL_TABLET | Freq: Two times a day (BID) | ORAL | 0 refills | Status: AC

## 2019-03-24 NOTE — ED Notes (Signed)
Patient verbalizes understanding of discharge instructions. Opportunity for questioning and answers were provided. Armband removed by staff, pt discharged from ED ambulatory.   

## 2019-03-24 NOTE — ED Notes (Signed)
Pt attempting to give stool sample.

## 2019-03-24 NOTE — ED Provider Notes (Signed)
[facility] EMERGENCY [facility]PARTMENT Provider Note   CSN: [id number] Arrival date & time: 03/23/19  1938    History   Chief Complaint Chief Complaint  Patient presents with  . Abdominal Pain    HPI Joshua Austin is a 28 y.o. male.     Patient presents to the emergency department with a chief complaint of abdominal pain.  He reports associated diarrhea.  States symptoms started today.  States that he had a large bloody bowel movement today.  He denies any abdominal problems.  Denies any medical problems.  Denies taking any medications.  Denies any fever, chills, nausea, or vomiting.  Denies any foreign travel.  Denies any known exposures to COVID-19.  Denies any recent illnesses.  There are no aggravating factors.  The history is provided by the patient. No language interpreter was used.    History reviewed. No pertinent past medical history.  Patient Active Problem List   Diagnosis Date Noted  . Major depressive disorder, recurrent, severe without psychotic features (HCC)   . Severe major depression without psychotic features (HCC) 10/19/2014  . Severe recurrent major depressive disorder with psychotic features (HCC)   . Severe major depression (HCC) 10/18/2014  . Suicidal ideations 10/18/2014    Past Surgical History:  Procedure Laterality Date  . KNEE SURGERY          Home Medications    Prior to Admission medications   Medication Sig Start Date End Date Taking? Authorizing Provider  FLUoxetine (PROZAC) 20 MG capsule Take 1 capsule (20 mg total) by mouth daily. 10/24/14   Adonis BrookAgustin, Sheila, NP  zolpidem (AMBIEN) 5 MG tablet Take 1 tablet (5 mg total) by mouth at bedtime as needed for sleep. 10/24/14   Adonis BrookAgustin, Sheila, NP    Family History No family history on file.  Social History Social History   Tobacco Use  . Smoking status: Never Smoker  Substance Use Topics  . Alcohol use: No  . Drug use: No     Allergies   Patient has no known  allergies.   Review of Systems Review of Systems  All other systems reviewed and are negative.    Physical Exam Updated Vital Signs BP 118/74   Pulse 64   Temp 98.2 F (36.8 C) (Oral)   Resp 18   SpO2 100%   Physical Exam Vitals signs and nursing note reviewed.  Constitutional:      Appearance: He is well-developed.  HENT:     Head: Normocephalic and atraumatic.  Eyes:     Conjunctiva/sclera: Conjunctivae normal.  Neck:     Musculoskeletal: Neck supple.  Cardiovascular:     Rate and Rhythm: Normal rate and regular rhythm.     Heart sounds: No murmur.  Pulmonary:     Effort: Pulmonary effort is normal. No respiratory distress.     Breath sounds: Normal breath sounds.  Abdominal:     Palpations: Abdomen is soft.     Tenderness: There is no abdominal tenderness.     Comments: No focal abdominal tenderness, no RLQ tenderness or pain at McBurney's point, no RUQ tenderness or Murphy's sign, no left-sided abdominal tenderness, no fluid wave, or signs of peritonitis   Skin:    General: Skin is warm and dry.  Neurological:     Mental Status: He is alert and oriented to person, place, and time.  Psychiatric:        Mood and Affect: Mood normal.  Behavior: Behavior normal.      ED Treatments / Results  Labs (all labs ordered are listed, but only abnormal results are displayed) Labs Reviewed  POC OCCULT BLOOD, ED - Abnormal; Notable for the following components:      Result Value   Fecal Occult Bld POSITIVE (*)    All other components within normal limits  GASTROINTESTINAL PANEL BY PCR, STOOL (REPLACES STOOL CULTURE)  LIPASE, BLOOD  COMPREHENSIVE METABOLIC PANEL  CBC  URINALYSIS, ROUTINE W REFLEX MICROSCOPIC    EKG None  Radiology No results found.  Procedures Procedures (including critical care time)  Medications Ordered in ED Medications  sodium chloride flush (NS) 0.9 % injection 3 mL (has no administration in time range)  sodium chloride 0.9  % bolus 1,000 mL (has no administration in time range)  dicyclomine (BENTYL) capsule 10 mg (10 mg Oral Given 03/24/19 0015)     Initial Impression / Assessment and Plan / ED Course  I have reviewed the triage vital signs and the nursing notes.  Pertinent labs & imaging results that were available during my care of the patient were reviewed by me and considered in my medical decision making (see chart for details).        Patient with diarrhea. No abdominal tenderness. Doubt surgical or acute abdomen. He states that he had some blood in his diarrhea.  Denies any recent travel, or new foods.  Denies any fever.  He has had some crampy abdominal pain.  Laboratory work-up is reassuring, however he does have positive fecal occult blood.  Patient attempted to give stool sample, but was unable to.  Will cover for infectious diarrhea, but recommend that he follow-up with GI or PCP.  Return precautions given.  Patient agreeable with plan, and would like to be discharged.  Final Clinical Impressions(s) / ED Diagnoses   Final diagnoses:  Diarrhea, unspecified type    ED Discharge Orders         Ordered    ciprofloxacin (CIPRO) 500 MG tablet  2 times daily     03/24/19 0137    dicyclomine (BENTYL) 10 MG capsule  3 times daily before meals & bedtime     03/24/19 0137           Roxy Horseman, PA-C 03/24/19 0606    Palumbo, April, MD 03/24/19 [phone]

## 2019-03-24 NOTE — Discharge Instructions (Signed)
Please take medication as directed.  Please collect a stool sample and take it to your doctor.  Return to the ER for worsening pain, or fever.

## 2019-03-24 NOTE — ED Notes (Signed)
PA aware that pt was unable to give enough stool sample to test

## 2020-08-13 ENCOUNTER — Other Ambulatory Visit: Payer: Self-pay

## 2020-08-13 DIAGNOSIS — Z20822 Contact with and (suspected) exposure to covid-19: Secondary | ICD-10-CM

## 2020-08-15 LAB — NOVEL CORONAVIRUS, NAA: SARS-CoV-2, NAA: NOT DETECTED

## 2020-08-15 LAB — SARS-COV-2, NAA 2 DAY TAT

## 2021-01-15 ENCOUNTER — Encounter (HOSPITAL_COMMUNITY): Payer: Self-pay

## 2021-01-15 ENCOUNTER — Ambulatory Visit (INDEPENDENT_AMBULATORY_CARE_PROVIDER_SITE_OTHER): Payer: Self-pay

## 2021-01-15 ENCOUNTER — Ambulatory Visit (HOSPITAL_COMMUNITY)
Admission: EM | Admit: 2021-01-15 | Discharge: 2021-01-15 | Disposition: A | Discharge status: Home / Self Care | Payer: Self-pay | Attending: Medical Oncology | Admitting: Medical Oncology

## 2021-01-15 DIAGNOSIS — M25531 Pain in right wrist: Secondary | ICD-10-CM

## 2021-01-15 DIAGNOSIS — S63501A Unspecified sprain of right wrist, initial encounter: Secondary | ICD-10-CM

## 2021-01-15 DIAGNOSIS — W19XXXA Unspecified fall, initial encounter: Secondary | ICD-10-CM

## 2021-01-15 MED ORDER — IBUPROFEN 600 MG PO TABS: 600.0000 mg | ORAL_TABLET | Freq: Four times a day (QID) | ORAL | 0 refills | Status: AC | PRN

## 2021-01-15 NOTE — ED Provider Notes (Signed)
[facility]    CSN: [id number] Arrival date & time: 01/15/21  1027      History   Chief Complaint Chief Complaint  Patient presents with  . Hand Pain    HPI Joshua Austin is a 30 y.o. male.   HPI  Wrist Pain: Patient states about 2 to 3 weeks ago he fell and used his right hand to stop his fall.  He states that after that time he has had right wrist pain.  He states that the pain is mainly on the dorsal aspect of his wrist.  The pain worsens when he flexes the wrist or extends the wrist.  Pain also occurs if he tries to lift anything heavy.  He states that he was using a wrist brace which helped but about 2 days ago he was lifting a heavier item at work and felt the pain returned.  He states that he wanted to have this assessed as he did not have been originally assessed when the injury occurred.  No numbness and tingling of hands, edema of the fingers or skin color changes. History reviewed. No pertinent past medical history.  Patient Active Problem List   Diagnosis Date Noted  . Major depressive disorder, recurrent, severe without psychotic features (HCC)   . Severe major depression without psychotic features (HCC) 10/19/2014  . Severe recurrent major depressive disorder with psychotic features (HCC)   . Severe major depression (HCC) 10/18/2014  . Suicidal ideations 10/18/2014    Past Surgical History:  Procedure Laterality Date  . KNEE SURGERY       Home Medications    Prior to Admission medications   Medication Sig Start Date End Date Taking? Authorizing Provider  ibuprofen (ADVIL) 600 MG tablet Take 1 tablet (600 mg total) by mouth every 6 (six) hours as needed. 01/15/21  Yes Jarmel Linhardt, Maralyn Sago M, PA-C  ciprofloxacin (CIPRO) 500 MG tablet Take 1 tablet (500 mg total) by mouth 2 (two) times daily. 03/24/19   Roxy Horseman, PA-C  dicyclomine (BENTYL) 10 MG capsule Take 1 capsule (10 mg total) by mouth 4 (four) times daily -  before meals and at bedtime.  03/24/19   Roxy Horseman, PA-C  FLUoxetine (PROZAC) 20 MG capsule Take 1 capsule (20 mg total) by mouth daily. Patient not taking: Reported on 03/24/2019 10/24/14   Adonis Brook, NP  zolpidem (AMBIEN) 5 MG tablet Take 1 tablet (5 mg total) by mouth at bedtime as needed for sleep. Patient not taking: Reported on 03/24/2019 10/24/14   Adonis Brook, NP    Family History History reviewed. No pertinent family history.  Social History Social History   Tobacco Use  . Smoking status: Never Smoker  . Smokeless tobacco: Never Used  Substance Use Topics  . Alcohol use: No  . Drug use: No     Allergies   Patient has no known allergies.   Review of Systems Review of Systems  As stated above in HPI Physical Exam Triage Vital Signs ED Triage Vitals  Enc Vitals Group     BP 01/15/21 1112 (!) 106/50     Pulse Rate 01/15/21 1112 64     Resp 01/15/21 1112 18     Temp 01/15/21 1112 98.1 F (36.7 C)     Temp src --      SpO2 01/15/21 1112 97 %     Weight --      Height --      Head Circumference --  Peak Flow --      Pain Score 01/15/21 1111 7     Pain Loc --      Pain Edu? --      Excl. in GC? --    No data found.  Updated Vital Signs BP 107/61   Pulse 64   Temp 98.1 F (36.7 C)   Resp 18   SpO2 97%   Physical Exam Vitals and nursing note reviewed.  Constitutional:      General: He is not in acute distress.    Appearance: Normal appearance. He is not ill-appearing, toxic-appearing or diaphoretic.  Musculoskeletal:     Comments: Pain and reduced ROM of right wrist flexion and extension. Moderate tenderness to palpation of the dorsal wrist. Negative fracture testing of the forearm and hand. Grip strength 4/5 of the right side due to pain of wrist.  Skin:    General: Skin is warm.     Capillary Refill: Capillary refill takes less than 2 seconds.     Coloration: Skin is not pale.     Findings: No bruising, erythema, lesion or rash.  Neurological:     General: No  focal deficit present.     Mental Status: He is alert.     Sensory: No sensory deficit.      UC Treatments / Results  Labs (all labs ordered are listed, but only abnormal results are displayed) Labs Reviewed - No data to display  EKG   Radiology DG Wrist Complete Right  Result Date: 01/15/2021 CLINICAL DATA:  Larey Seat playing basketball couple weeks ago and caught himself by his wrist. EXAM: RIGHT WRIST - COMPLETE 3+ VIEW COMPARISON:  None. FINDINGS: There is no evidence of fracture or dislocation. There is no evidence of arthropathy or other focal bone abnormality. Soft tissues are unremarkable. IMPRESSION: Negative. Electronically Signed   By: Emmaline Kluver M.D.   On: 01/15/2021 11:36    Procedures Procedures (including critical care time)  Medications Ordered in UC Medications - No data to display  Initial Impression / Assessment and Plan / UC Course  I have reviewed the triage vital signs and the nursing notes.  Pertinent labs & imaging results that were available during my care of the patient were reviewed by me and considered in my medical decision making (see chart for details).     New. X ray pending.  If negative would recommend a wrist brace for [redacted] weeks along with lifting restriction, NSAIDs.  If symptoms continue he may benefit from seeing physical therapy or an orthopedist.   Update: Wrist strain.  Final Clinical Impressions(s) / UC Diagnoses   Final diagnoses:  Sprain of right wrist, initial encounter   Discharge Instructions   None    ED Prescriptions    Medication Sig Dispense Auth. Provider   ibuprofen (ADVIL) 600 MG tablet Take 1 tablet (600 mg total) by mouth every 6 (six) hours as needed. 30 tablet Rushie Chestnut, [place]     PDMP not reviewed this encounter.   Rushie Chestnut, [place] 01/15/21 1148

## 2021-01-15 NOTE — ED Triage Notes (Addendum)
Pt in with c/o right hand pain that has been going on for a few weeks after he tripped and braced himself before he fell  Pt has been using wrist brace for relief  Denies numbness or tingling

## 2021-09-02 ENCOUNTER — Emergency Department (HOSPITAL_COMMUNITY)
Admission: EM | Admit: 2021-09-02 | Discharge: 2021-09-02 | Disposition: A | Discharge status: Home / Self Care | Payer: Self-pay | Attending: Emergency Medicine | Admitting: Emergency Medicine

## 2021-09-02 ENCOUNTER — Encounter (HOSPITAL_COMMUNITY): Payer: Self-pay | Admitting: Emergency Medicine

## 2021-09-02 DIAGNOSIS — M25512 Pain in left shoulder: Secondary | ICD-10-CM | POA: Insufficient documentation

## 2021-09-02 NOTE — ED Triage Notes (Signed)
Pt endorses left shoulder pain for 3 days. Works with heavy lifting. Hx of fx to left shoulder years ago.

## 2021-09-02 NOTE — Discharge Instructions (Addendum)
Please use Tylenol or ibuprofen for pain.  You may use 600 mg ibuprofen every 6 hours or 1000 mg of Tylenol every 6 hours.  You may choose to alternate between the 2.  This would be most effective.  Not to exceed 4 g of Tylenol within 24 hours.  Not to exceed 3200 mg ibuprofen 24 hours.  Call [facility] today to set up an appointment for possible impingement of rotator cuff.

## 2021-09-02 NOTE — ED Provider Notes (Signed)
[facility] EMERGENCY DEPARTMENT Provider Note   CSN: [id number] Arrival date & time: 09/02/21  0940     History Chief Complaint  Patient presents with   Shoulder Pain    Joshua Austin is a 30 y.o. male. Patient presents the emergency department with left shoulder pain that he has been having for 3 days.  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  He says that he has shooting pains going down his left arm.  It is worse when he moves his arm a certain direction.  He has been trying ibuprofen at home with no relief.  Denies any numbness of his arm.  Denies any neck pain   Shoulder Pain Associated symptoms: no back pain and no fever       History reviewed. No pertinent past medical history.  Patient Active Problem List   Diagnosis Date Noted   Major depressive disorder, recurrent, severe without psychotic features (HCC)    Severe major depression without psychotic features (HCC) 10/19/2014   Severe recurrent major depressive disorder with psychotic features (HCC)    Severe major depression (HCC) 10/18/2014   Suicidal ideations 10/18/2014    Past Surgical History:  Procedure Laterality Date   KNEE SURGERY         No family history on file.  Social History   Tobacco Use   Smoking status: Never   Smokeless tobacco: Never  Substance Use Topics   Alcohol use: No   Drug use: No    Home Medications Prior to Admission medications   Medication Sig Start Date End Date Taking? Authorizing Provider  ciprofloxacin (CIPRO) 500 MG tablet Take 1 tablet (500 mg total) by mouth 2 (two) times daily. 03/24/19   Roxy Horseman, PA-C  dicyclomine (BENTYL) 10 MG capsule Take 1 capsule (10 mg total) by mouth 4 (four) times daily -  before meals and at bedtime. 03/24/19   Roxy Horseman, PA-C  FLUoxetine (PROZAC) 20 MG capsule Take 1 capsule (20 mg total) by mouth daily. Patient not taking: Reported on 03/24/2019 10/24/14   Adonis Brook, NP  ibuprofen (ADVIL) 600 MG tablet Take 1 tablet (600 mg total) by mouth every 6 (six) hours as needed. 01/15/21   Rushie Chestnut, PA-C  zolpidem (AMBIEN) 5 MG tablet Take 1 tablet (5 mg total) by mouth at bedtime as needed for sleep. Patient not taking: Reported on 03/24/2019 10/24/14   Adonis Brook, NP    Allergies    Patient has no known allergies.  Review of Systems   Review of Systems  Constitutional:  Negative for chills and fever.  HENT:  Negative for congestion, rhinorrhea and sore throat.   Eyes:  Negative for visual disturbance.  Respiratory:  Negative for cough, chest tightness and shortness of breath.   Cardiovascular:  Negative for chest pain, palpitations and leg swelling.  Gastrointestinal:  Negative for abdominal pain, blood in stool, constipation, diarrhea, nausea and vomiting.  Genitourinary:  Negative for dysuria, flank pain and hematuria.  Musculoskeletal:  Positive for arthralgias. Negative for back pain.  Skin:  Negative for rash and wound.  Neurological:  Negative for dizziness, syncope, weakness, light-headedness and headaches.  Psychiatric/Behavioral:  Negative for confusion.   All other systems reviewed and are negative.  Physical Exam Updated Vital Signs BP 115/62   Pulse (!) 50   Temp 98 F (36.7 C)   Resp 16   Ht 5\' 8"  (1.727 m)   SpO2  99%   BMI 20.53 kg/m   Physical Exam Vitals and nursing note reviewed.  Constitutional:      General: He is not in acute distress.    Appearance: Normal appearance. He is well-developed. He is not ill-appearing, toxic-appearing or diaphoretic.  HENT:     Head: Normocephalic and atraumatic.     Nose: No nasal deformity.     Mouth/Throat:     Lips: Pink. No lesions.  Eyes:     General: Gaze aligned appropriately. No scleral icterus.       Right eye: No discharge.        Left eye: No discharge.     Conjunctiva/sclera: Conjunctivae normal.     Right eye: Right conjunctiva is not injected. No  exudate or hemorrhage.    Left eye: Left conjunctiva is not injected. No exudate or hemorrhage. Pulmonary:     Effort: Pulmonary effort is normal. No respiratory distress.  Musculoskeletal:     Right shoulder: Normal.     Comments: Full ROM of shoulder, elbow and wrist Strength 5/5 and symmetric with hand grip, wrist flexion and extension, elbow flexion/extension and shoulder abduction/adduction and flexion/extension Sensation tested and grossly intact and symmetric in fingers and dorsal forearms  Impingement sign positive.    Skin:    General: Skin is warm and dry.  Neurological:     Mental Status: He is alert and oriented to person, place, and time.  Psychiatric:        Mood and Affect: Mood normal.        Speech: Speech normal.        Behavior: Behavior normal. Behavior is cooperative.    ED Results / Procedures / Treatments   Labs (all labs ordered are listed, but only abnormal results are displayed) Labs Reviewed - No data to display  EKG None  Radiology No results found.  Procedures Procedures   Medications Ordered in ED Medications - No data to display  ED Course  I have reviewed the triage vital signs and the nursing notes.  Pertinent labs & imaging results that were available during my care of the patient were reviewed by me and considered in my medical decision making (see chart for details).    MDM Rules/Calculators/A&P                         Patient presenting with left-sided shoulder pain. He is well-appearing and vitals are stable. On exam he had signs of rotator cuff impingement.  No tenderness to palpation of the shoulder joint, clavicle, cervical spine.  No concern for shoulder dislocation.  Do not feel that there is any need for imaging at this time. Recommend conservative treatment and follow up with Orthopedics. I provided this information in patient's discharge paperwork.   Final Clinical Impression(s) / ED Diagnoses Final diagnoses:  Acute  pain of left shoulder    Rx / DC Orders ED Discharge Orders     None        Claudie Leach, PA-C 09/02/21 1414    Jacalyn Lefevre, MD 09/02/21 1416

## 2022-06-18 ENCOUNTER — Other Ambulatory Visit: Payer: Self-pay

## 2022-06-18 ENCOUNTER — Emergency Department (HOSPITAL_BASED_OUTPATIENT_CLINIC_OR_DEPARTMENT_OTHER)
Admission: EM | Admit: 2022-06-18 | Discharge: 2022-06-18 | Disposition: A | Discharge status: Home / Self Care | Payer: Self-pay | Attending: Emergency Medicine | Admitting: Emergency Medicine

## 2022-06-18 ENCOUNTER — Encounter (HOSPITAL_BASED_OUTPATIENT_CLINIC_OR_DEPARTMENT_OTHER): Payer: Self-pay

## 2022-06-18 DIAGNOSIS — H02845 Edema of left lower eyelid: Secondary | ICD-10-CM | POA: Insufficient documentation

## 2022-06-18 DIAGNOSIS — Z23 Encounter for immunization: Secondary | ICD-10-CM | POA: Insufficient documentation

## 2022-06-18 DIAGNOSIS — H0289 Other specified disorders of eyelid: Secondary | ICD-10-CM

## 2022-06-18 MED ORDER — TETRACAINE HCL 0.5 % OP SOLN
2.0000 [drp] | Freq: Once | OPHTHALMIC | Status: DC
  Filled 2022-06-18: qty 4

## 2022-06-18 MED ORDER — FLUORESCEIN SODIUM 1 MG OP STRP
1.0000 | ORAL_STRIP | Freq: Once | OPHTHALMIC | Status: AC
  Administered 2022-06-18: 1 via OPHTHALMIC
  Filled 2022-06-18: qty 1

## 2022-06-18 MED ORDER — TETANUS-DIPHTH-ACELL PERTUSSIS 5-2.5-18.5 LF-MCG/0.5 IM SUSY
0.5000 mL | PREFILLED_SYRINGE | Freq: Once | INTRAMUSCULAR | Status: AC
  Administered 2022-06-18: 0.5 mL via INTRAMUSCULAR
  Filled 2022-06-18: qty 0.5

## 2022-06-18 NOTE — ED Provider Notes (Signed)
[facility] EMERGENCY DEPT Provider Note   CSN: [id number] Arrival date & time: 06/18/22  1145     History  Chief Complaint  Patient presents with   Eye Pain    Joshua Austin is a 31 y.o. male who presents today for evaluation of pain and swelling of the left lower eyelid.  He states that a few weeks/months ago he felt like he got a piece of tree bark in his eye.  They irrigated his eye.  He did have some swelling of the mid lower lid then however that resolved after a week.   He states that his symptoms recurred over the past 3 to 4 days.    He does not know when his last tetanus shot was  He also reports that in his left eye near the tear duct he has a area of pencil lead "stuck in the eye" he states this has been there since fifth grade.  He feels like it is beginning to get more prominent over the past 1 to 2 months.  He notes that he has not had his vision checked in many years but thinks he may need glasses as he is unable to read the menu.  HPI     Home Medications Prior to Admission medications   Not on File      Allergies    Patient has no known allergies.    Review of Systems   Review of Systems  Physical Exam Updated Vital Signs BP 115/70 (BP Location: Right Arm)   Pulse (!) 58   Temp 97.7 F (36.5 C)   Resp 18   Ht 5' 10.5" (1.791 m)   Wt 79.8 kg   SpO2 100%   BMI 24.90 kg/m  Physical Exam Vitals and nursing note reviewed.  Constitutional:      General: He is not in acute distress. HENT:     Head: Normocephalic and atraumatic.  Eyes:     General: No scleral icterus.       Right eye: No discharge.        Left eye: No discharge.     Extraocular Movements: Extraocular movements intact.     Conjunctiva/sclera: Conjunctivae normal.     Pupils: Pupils are equal, round, and reactive to light.     Comments: No photophobia nor consensual photophobia.  No evidence of entrapment.  There is edema around the left mid lower lid with mild  erythema without significant induration or fluctuance.  No drainage.  Bilateral conjunctiva are not injected, no chemosis.    No occular foreign bodies noted.  No fluorescein uptake in the left eye.  There is a palpable area of firmness over the left medial canthus with superficial skin tattooing consistent with where patient reports he has had a piece of pencil lead stuck for 15+ years.  No active drainage.  This area is nontender and does not appear to be acutely infected.  Cardiovascular:     Rate and Rhythm: Normal rate.  Pulmonary:     Effort: Pulmonary effort is normal. No respiratory distress.  Musculoskeletal:     Cervical back: No rigidity.  Neurological:     Mental Status: He is alert. Mental status is at baseline.     Comments: Awake and alert, answers all questions appropriately.  Speech is not slurred.    Psychiatric:        Mood and Affect: Mood normal.        ED Results / Procedures / Treatments  Labs (all labs ordered are listed, but only abnormal results are displayed) Labs Reviewed - No data to display  EKG None  Radiology No results found.  Procedures Procedures    Medications Ordered in ED Medications  fluorescein ophthalmic strip 1 strip (1 strip Both Eyes Given 06/18/22 1256)  Tdap (BOOSTRIX) injection 0.5 mL (0.5 mLs Intramuscular Given 06/18/22 1341)    ED Course/ Medical Decision Making/ A&P                           Medical Decision Making Physical exam with left lower lid erythema, tenderness and mild swelling without injection of the cornea or conjunctiva, consistent with external hordeolum.  No purulent discharge, corneal abrasions, entrapment, consensual photophobia, or dendritic staining with fluorescein study to either eye.  No induration of the eyelid to suggest periorbital cellulitis. Presentation non-concerning for iritis, bacterial conjunctivitis, corneal abrasions, or HSV.     Patient does have a history of a remote foreign body  near the medial canthus.  This area is nonerythematous, nontender, without abnormal fluctuance and there is no obvious drainage from the tear duct.    Patient did mention possibility of the stye developing after he had gotten something in his eye months ago, however given that his symptoms then resolved without antibiotics I have a lower suspicion for a lower eyelid foreign body that is retained.  We will update Tdap.  Given patient's poor visual acuity bilaterally I discussed with him that he needs to follow-up for both his stye and for consideration of corrective lenses.  He states that his visual acuity is not significantly changed today.       Problems Addressed: Pain and swelling of lower eyelid of left eye: complicated acute illness or injury    Details: Complicated by foreign body near medial canthus present for 15+ years  Amount and/or Complexity of Data Reviewed Independent Historian: spouse  Risk OTC drugs. Prescription drug management. Decision regarding hospitalization.   Return precautions were discussed with patient who states their understanding.  At the time of discharge patient denied any unaddressed complaints or concerns.  Patient is agreeable for discharge home.  Note: Portions of this report may have been transcribed using voice recognition software. Every effort was made to ensure accuracy; however, inadvertent computerized transcription errors may be present         Final Clinical Impression(s) / ED Diagnoses Final diagnoses:  Pain and swelling of lower eyelid of left eye    Rx / DC Orders ED Discharge Orders     None         Norman Clay 06/19/22 2132    Virgina Norfolk, DO 06/20/22 [phone]

## 2022-06-18 NOTE — ED Notes (Signed)
Patient verbalizes understanding of discharge instructions. Opportunity for questioning and answers were provided. Patient discharged from ED.  °

## 2022-06-18 NOTE — Discharge Instructions (Addendum)
Please schedule an appointment with Dr. Allena Katz.  Tell the office staff you are an emergency room follow up.  Additionally based on your eye vision exam today you most likely need glasses.   Please do not pick at, squeeze, or poke the area of swelling.  This can make your symptoms worse.  Please take Ibuprofen (Advil, motrin) and Tylenol (acetaminophen) to relieve your pain.    You may take up to 600 MG (3 pills) of normal strength ibuprofen every 8 hours as needed.   You make take tylenol, up to 1,000 mg (two extra strength pills) every 8 hours as needed.   It is safe to take ibuprofen and tylenol at the same time as they work differently.   Do not take more than 3,000 mg tylenol in a 24 hour period (not more than one dose every 8 hours.  Please check all medication labels as many medications such as pain and cold medications may contain tylenol.  Do not drink alcohol while taking these medications.  Do not take other NSAID'S while taking ibuprofen (such as aleve or naproxen).  Please take ibuprofen with food to decrease stomach upset.  Please return for fevers, worsening pain/redness, significant vision changes, or if you have any new or concerning symptoms.

## 2022-06-18 NOTE — ED Triage Notes (Signed)
Patient here POV from Home.  Endorses Possible FB to Left Eye Several Months ago that caused Irritation and Swelling. Symptoms subsided but then returned again over the Past Week.   States he has a Piece of Lead stuck in his Left Eye from Fifth Grade that has never been removed.   No Visual Aids. States Mild Blurriness to Left Eye.  NAD Noted during Triage. A&Ox4. GCS 15. Ambulatory.

## 2022-07-03 ENCOUNTER — Other Ambulatory Visit: Payer: Self-pay

## 2022-07-03 ENCOUNTER — Encounter (HOSPITAL_BASED_OUTPATIENT_CLINIC_OR_DEPARTMENT_OTHER): Payer: Self-pay | Admitting: Emergency Medicine

## 2022-07-03 ENCOUNTER — Emergency Department (HOSPITAL_BASED_OUTPATIENT_CLINIC_OR_DEPARTMENT_OTHER)
Admission: EM | Admit: 2022-07-03 | Discharge: 2022-07-03 | Disposition: A | Discharge status: Home / Self Care | Payer: Self-pay | Attending: Emergency Medicine | Admitting: Emergency Medicine

## 2022-07-03 DIAGNOSIS — R21 Rash and other nonspecific skin eruption: Secondary | ICD-10-CM | POA: Insufficient documentation

## 2022-07-03 MED ORDER — PREDNISONE 10 MG PO TABS: 20.0000 mg | ORAL_TABLET | Freq: Every day | ORAL | 0 refills | Status: AC

## 2022-07-03 MED ORDER — DIPHENHYDRAMINE HCL 25 MG PO CAPS
25.0000 mg | ORAL_CAPSULE | Freq: Once | ORAL | Status: AC
  Administered 2022-07-03: 25 mg via ORAL
  Filled 2022-07-03: qty 1

## 2022-07-03 NOTE — ED Triage Notes (Signed)
Rash. Joshua Austin has similar. Started 4-5 days ago. Itching.  Red blotchy on arm legs , torso. Tried itch cream, helped some.

## 2022-07-03 NOTE — ED Provider Notes (Signed)
[facility] EMERGENCY DEPT Provider Note   CSN: [id number] Arrival date & time: 07/03/22  1651     History  Chief Complaint  Patient presents with   Rash    Joshua Austin is a 31 y.o. male.  HPI 31 year old male presents today complaining of maculopapular rash that is itchy in nature.  His girlfriend is present with similar symptoms.  They both had symptoms for about 2 days.  Hers began first.  They have looked for bedbugs and not seeing any evidence of infestation.  The lesions are on his forearms bilaterally. No associated fever, chills, cough, redness, or other new symptoms     Home Medications Prior to Admission medications   Medication Sig Start Date End Date Taking? Authorizing Provider  predniSONE (DELTASONE) 10 MG tablet Take 2 tablets (20 mg total) by mouth daily. 07/03/22  Yes Margarita Grizzle, MD  ciprofloxacin (CIPRO) 500 MG tablet Take 1 tablet (500 mg total) by mouth 2 (two) times daily. 03/24/19   Roxy Horseman, PA-C  dicyclomine (BENTYL) 10 MG capsule Take 1 capsule (10 mg total) by mouth 4 (four) times daily -  before meals and at bedtime. 03/24/19   Roxy Horseman, PA-C  FLUoxetine (PROZAC) 20 MG capsule Take 1 capsule (20 mg total) by mouth daily. Patient not taking: Reported on 03/24/2019 10/24/14   Adonis Brook, NP  ibuprofen (ADVIL) 600 MG tablet Take 1 tablet (600 mg total) by mouth every 6 (six) hours as needed. 01/15/21   Rushie Chestnut, PA-C  zolpidem (AMBIEN) 5 MG tablet Take 1 tablet (5 mg total) by mouth at bedtime as needed for sleep. Patient not taking: Reported on 03/24/2019 10/24/14   Adonis Brook, NP      Allergies    Patient has no known allergies.    Review of Systems   Review of Systems  Physical Exam Updated Vital Signs BP 123/67 (BP Location: Right Arm)   Pulse 60   Temp 98.2 F (36.8 C)   Resp 18   SpO2 100%  Physical Exam Vitals and nursing note reviewed.  Constitutional:      Appearance: He is  well-developed.  HENT:     Head: Normocephalic and atraumatic.     Right Ear: External ear normal.     Left Ear: External ear normal.     Nose: Nose normal.  Eyes:     Extraocular Movements: Extraocular movements intact.  Neck:     Trachea: No tracheal deviation.  Pulmonary:     Effort: Pulmonary effort is normal.  Musculoskeletal:        General: Normal range of motion.  Skin:    General: Skin is warm and dry.     Comments: Skin with a few scattered maculopapular lesions on his bilateral forearms  Neurological:     Mental Status: He is alert and oriented to person, place, and time.  Psychiatric:        Mood and Affect: Mood normal.        Behavior: Behavior normal.     ED Results / Procedures / Treatments   Labs (all labs ordered are listed, but only abnormal results are displayed) Labs Reviewed - No data to display  EKG None  Radiology No results found.  Procedures Procedures    Medications Ordered in ED Medications  diphenhydrAMINE (BENADRYL) capsule 25 mg (has no administration in time range)    ED Course/ Medical Decision Making/ A&P  Medical Decision Making          Final Clinical Impression(s) / ED Diagnoses Final diagnoses:  Rash    Rx / DC Orders ED Discharge Orders          Ordered    predniSONE (DELTASONE) 10 MG tablet  Daily        07/03/22 1752              Margarita Grizzle, MD 07/03/22 1752

## 2022-07-03 NOTE — Discharge Instructions (Signed)
Please continue Benadryl 25 mg every 4 hours as needed for itching Please take prednisone as prescribed Follow-up with your primary care doctor if not improving Return to the emergency department if you are having worsening symptoms especially shortness of breath, difficulty breathing, or other worsening symptoms

## 2022-08-05 ENCOUNTER — Encounter (HOSPITAL_BASED_OUTPATIENT_CLINIC_OR_DEPARTMENT_OTHER): Payer: Self-pay | Admitting: Emergency Medicine

## 2022-09-16 IMAGING — DX DG WRIST COMPLETE 3+V*R*
4 series · 4 of 4 positions shown · non-contrast
Comparison: None.

CLINICAL DATA: Fell playing basketball couple weeks ago and caught
himself by his wrist.

EXAM:
RIGHT WRIST - COMPLETE 3+ VIEW

[wrist pa]
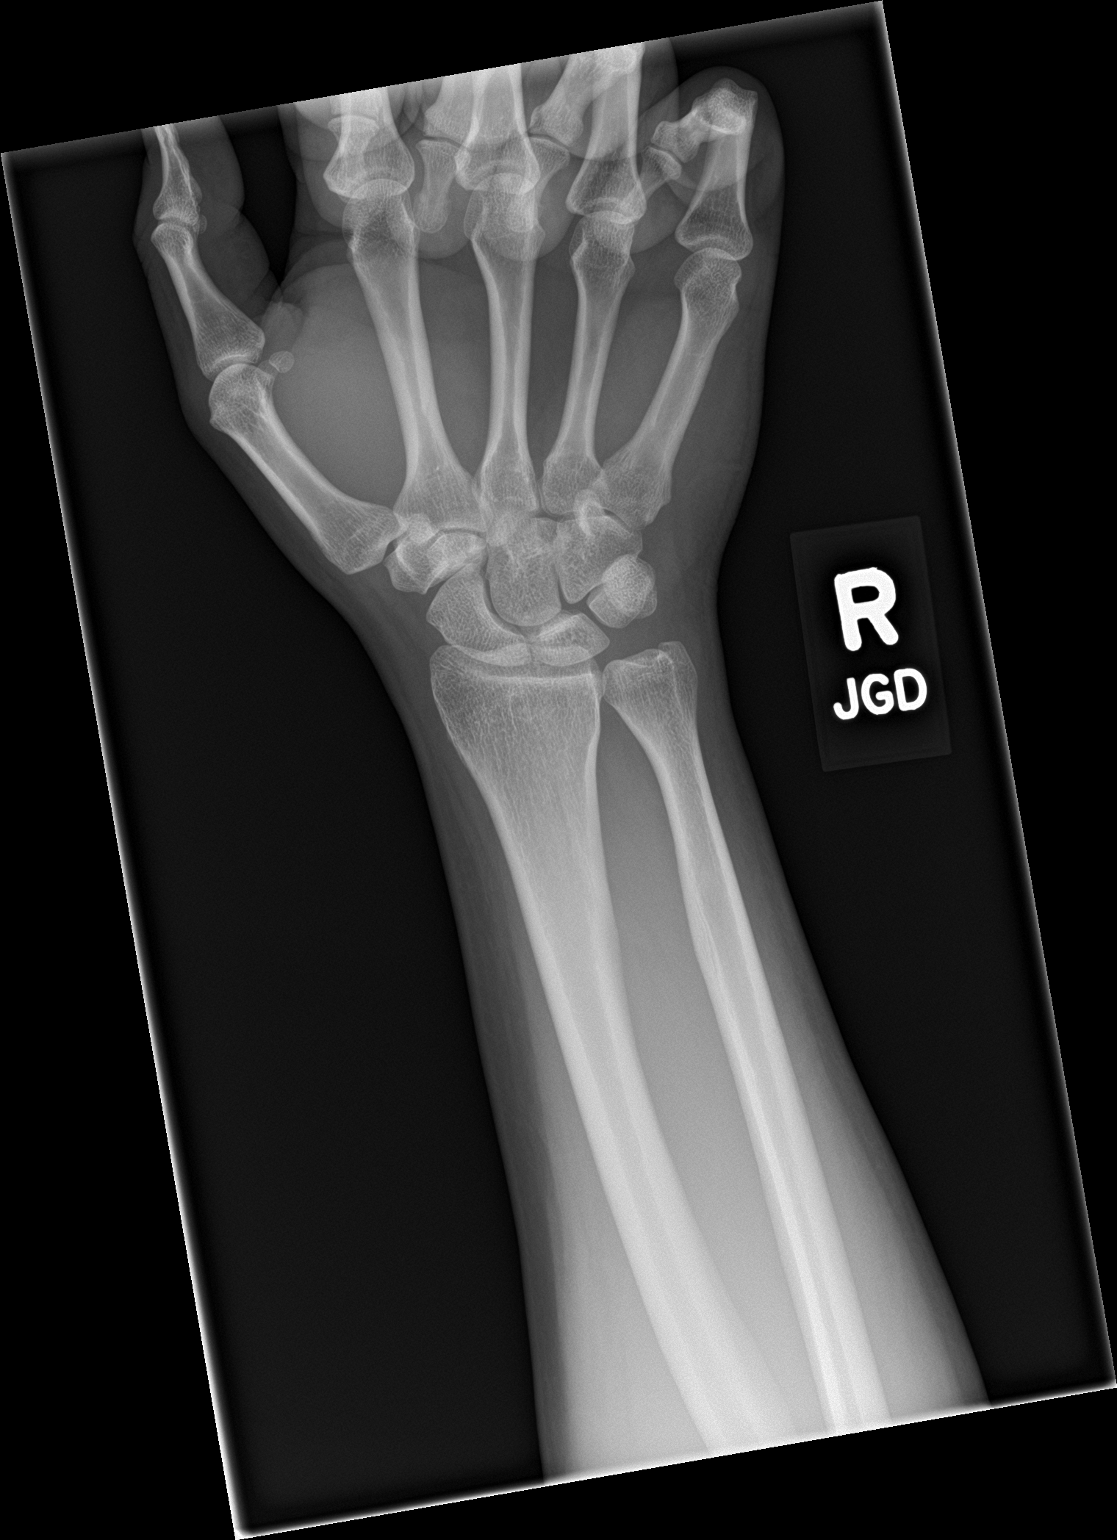

[wrist navicular]
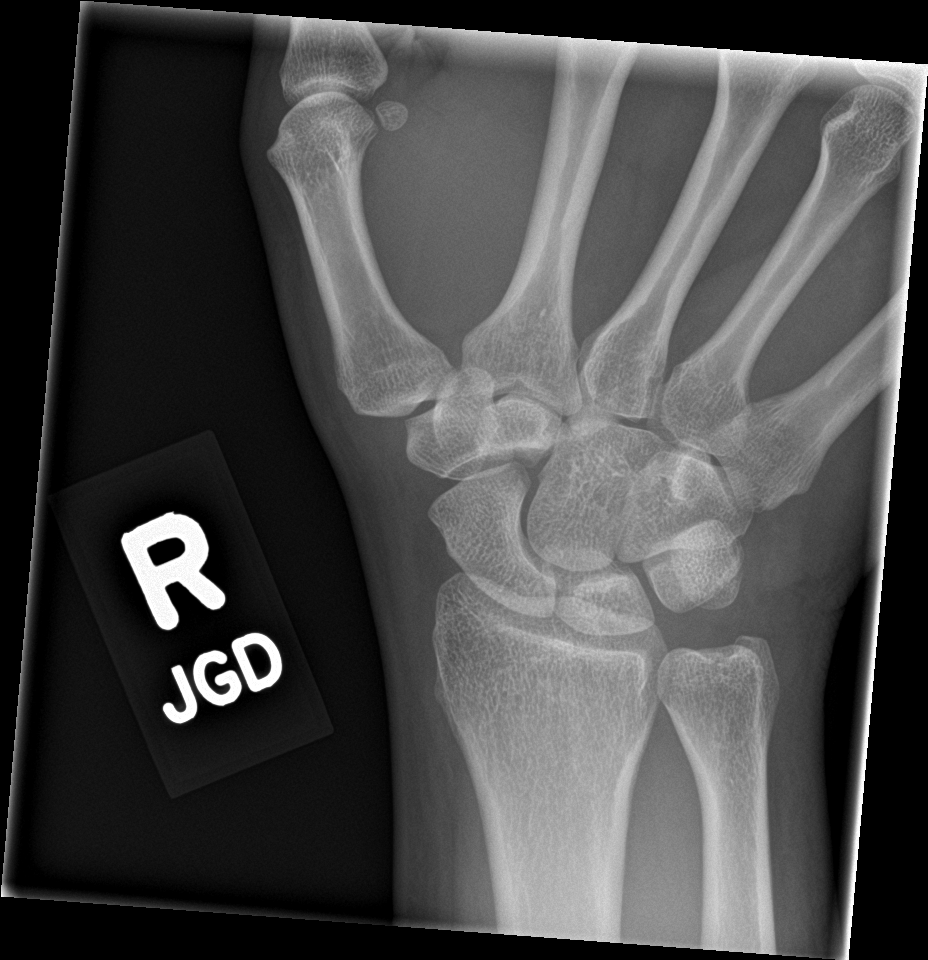

[wrist obl]
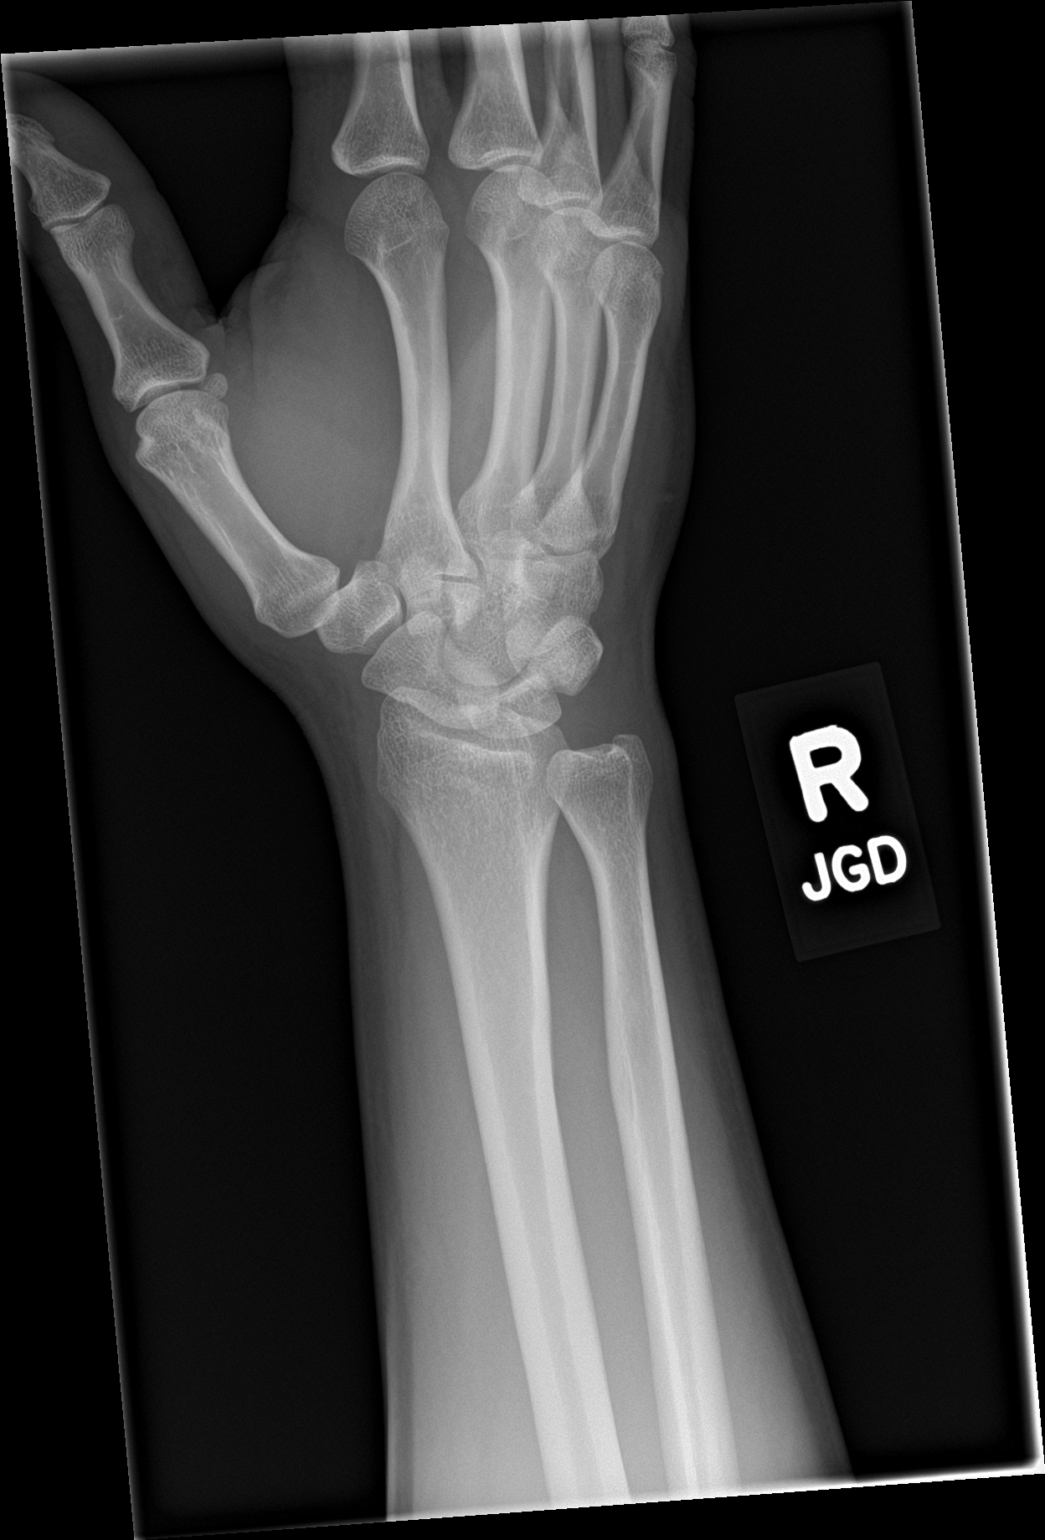

[wrist lat]
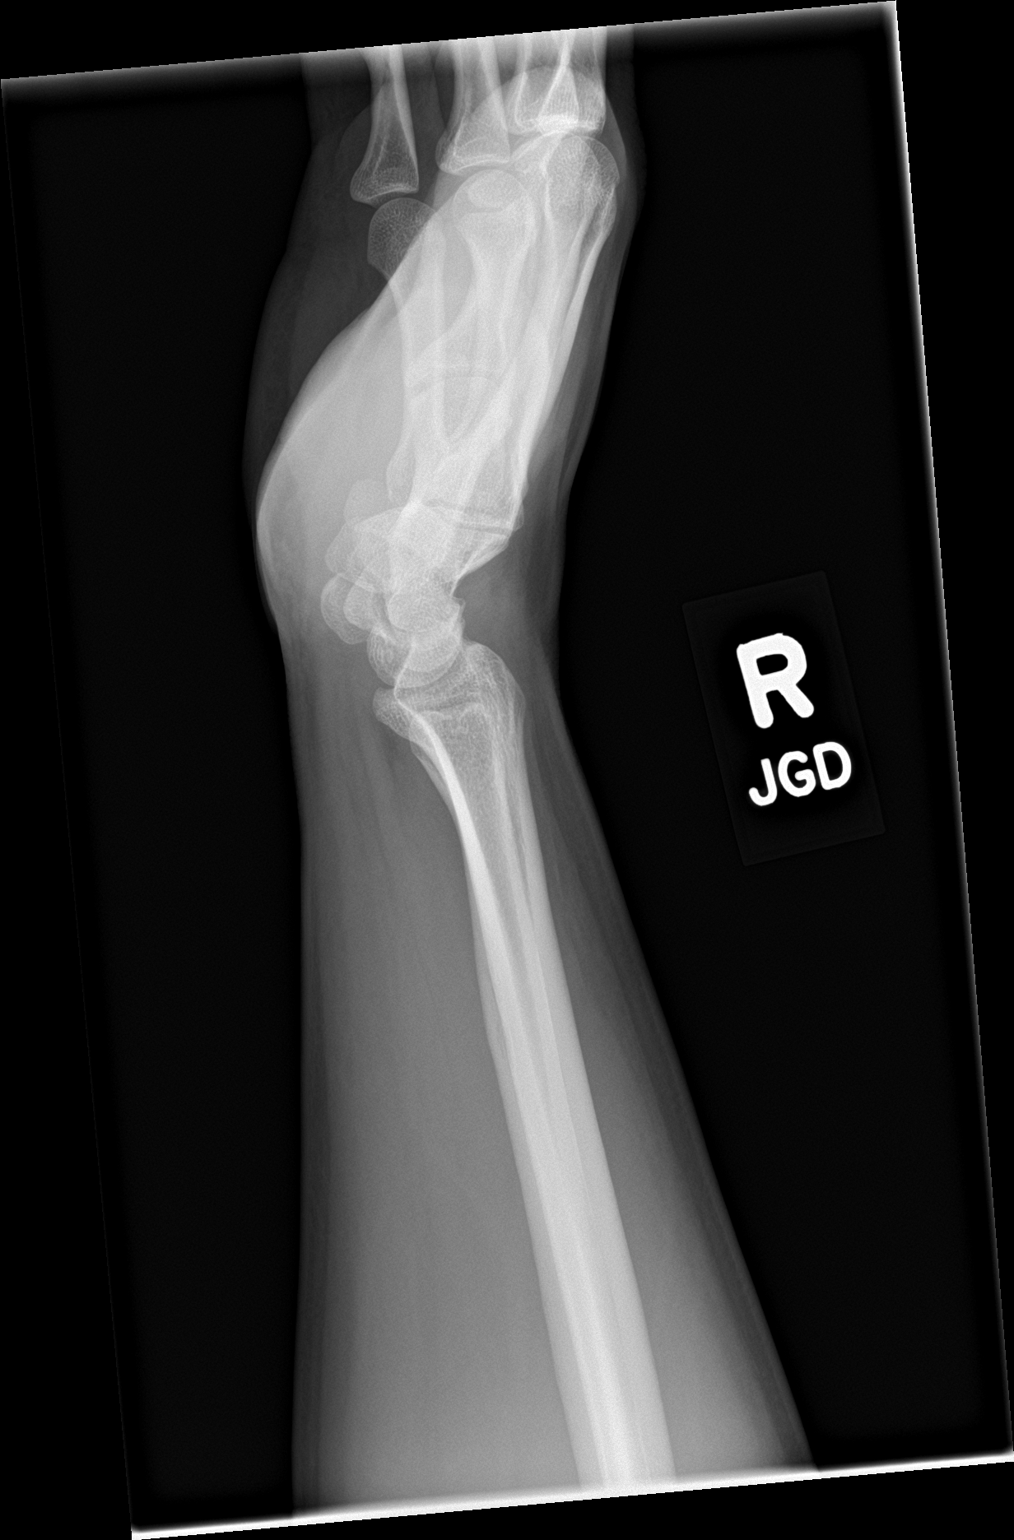

[4 of 4 positions shown; findings below may reference images not displayed]

FINDINGS: There is no evidence of fracture or dislocation. There is no
evidence of arthropathy or other focal bone abnormality. Soft
tissues are unremarkable.
IMPRESSION: Negative.

## 2022-10-26 ENCOUNTER — Other Ambulatory Visit: Payer: Self-pay

## 2022-10-26 ENCOUNTER — Encounter: Payer: Self-pay | Admitting: Emergency Medicine

## 2022-10-26 ENCOUNTER — Emergency Department
Admission: EM | Admit: 2022-10-26 | Discharge: 2022-10-27 | Disposition: A | Discharge status: Home / Self Care | Payer: Self-pay | Attending: Emergency Medicine | Admitting: Emergency Medicine

## 2022-10-26 DIAGNOSIS — K047 Periapical abscess without sinus: Secondary | ICD-10-CM

## 2022-10-26 MED ORDER — NAPROXEN 500 MG PO TABS: 500.0000 mg | ORAL_TABLET | Freq: Two times a day (BID) | ORAL | 2 refills | Status: AC

## 2022-10-26 MED ORDER — PENICILLIN V POTASSIUM 250 MG PO TABS: 250.0000 mg | ORAL_TABLET | Freq: Four times a day (QID) | ORAL | 0 refills | Status: AC

## 2022-10-26 NOTE — ED Triage Notes (Signed)
Patient to ED for dental pain. Patient states he had a tooth break off on lower jaw. Pain that radiates into jaw. Believes food as got into it.

## 2022-10-26 NOTE — ED Provider Notes (Signed)
duplicate   Jene Every, MD 10/30/22 [phone]

## 2022-10-27 ENCOUNTER — Encounter (HOSPITAL_BASED_OUTPATIENT_CLINIC_OR_DEPARTMENT_OTHER): Payer: Self-pay | Admitting: Emergency Medicine

## 2022-10-30 NOTE — ED Provider Notes (Signed)
   [facility] Provider Note    None    (approximate)   History   Dental Pain   HPI  Joshua Austin is a 31 y.o. male who presents with complaints of dental pain x 2 to 3 days.  Has not take anything for this.  No intraoral swelling.  No fevers     Physical Exam   Triage Vital Signs: ED Triage Vitals  Enc Vitals Group     BP 10/26/22 1608 124/78     Pulse Rate 10/26/22 1608 62     Resp 10/26/22 1608 18     Temp 10/26/22 1608 98 F (36.7 C)     Temp Source 10/26/22 1608 Oral     SpO2 10/26/22 1608 100 %     Weight --      Height --      Head Circumference --      Peak Flow --      Pain Score 10/26/22 1607 9     Pain Loc --      Pain Edu? --      Excl. in GC? --     Most recent vital signs: Vitals:   10/26/22 1608  BP: 124/78  Pulse: 62  Resp: 18  Temp: 98 F (36.7 C)  SpO2: 100%     General: Awake, no distress.  CV:  Good peripheral perfusion.  Resp:  Normal effort.  Abd:  No distention.  Other:  Left lower molar decayed, mild erythema surrounding, no obvious abscess   ED Results / Procedures / Treatments   Labs (all labs ordered are listed, but only abnormal results are displayed) Labs Reviewed - No data to display   EKG     RADIOLOGY     PROCEDURES:  Critical Care performed:   Procedures   MEDICATIONS ORDERED IN ED: Medications - No data to display   IMPRESSION / MDM / ASSESSMENT AND PLAN / ED COURSE  I reviewed the triage vital signs and the nursing notes. Patient's presentation is most consistent with acute, uncomplicated illness.  Patient presents with dental pain above as above.  Differential includes dental infection, cavity, abscess.  Not consistent with abscess, reassuring exam, no intraoral swelling.  Antibiotics provided        FINAL CLINICAL IMPRESSION(S) / ED DIAGNOSES   Final diagnoses:  Dental infection     Rx / DC Orders   ED Discharge Orders          Ordered     penicillin v potassium (VEETID) 250 MG tablet  4 times daily        10/26/22 1656    naproxen (NAPROSYN) 500 MG tablet  2 times daily with meals        10/26/22 1656             Note:  This document was prepared using Dragon voice recognition software and may include unintentional dictation errors.   Jene Every, MD 10/30/22 [phone]
